# Patient Record
Sex: Female | Born: 1980 | Hispanic: No | Marital: Married | Smoking: Never smoker
Health system: Southern US, Community
[De-identification: ages and names within clinical notes are randomized; demographics above are authoritative.]

## PROBLEM LIST (undated history)

## (undated) DIAGNOSIS — Z8701 Personal history of pneumonia (recurrent): Secondary | ICD-10-CM

## (undated) DIAGNOSIS — Z5189 Encounter for other specified aftercare: Secondary | ICD-10-CM

## (undated) DIAGNOSIS — Z8619 Personal history of other infectious and parasitic diseases: Secondary | ICD-10-CM

## (undated) HISTORY — DX: Personal history of other infectious and parasitic diseases: Z86.19

## (undated) HISTORY — DX: Encounter for other specified aftercare: Z51.89

## (undated) HISTORY — PX: OVARIAN CYST REMOVAL: SHX89

## (undated) HISTORY — PX: OTHER SURGICAL HISTORY: SHX169

## (undated) HISTORY — DX: Personal history of pneumonia (recurrent): Z87.01

---
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
## 2004-03-19 ENCOUNTER — Ambulatory Visit: Payer: Self-pay

## 2004-04-06 ENCOUNTER — Emergency Department: Payer: Self-pay | Admitting: Emergency Medicine

## 2004-04-07 ENCOUNTER — Ambulatory Visit: Payer: Self-pay | Admitting: Emergency Medicine

## 2005-02-12 ENCOUNTER — Ambulatory Visit: Payer: Self-pay

## 2005-09-13 ENCOUNTER — Other Ambulatory Visit: Payer: Self-pay

## 2005-09-13 ENCOUNTER — Emergency Department: Payer: Self-pay | Admitting: Internal Medicine

## 2006-01-28 ENCOUNTER — Observation Stay: Payer: Self-pay

## 2006-02-01 ENCOUNTER — Inpatient Hospital Stay: Payer: Self-pay | Admitting: Obstetrics & Gynecology

## 2007-04-28 HISTORY — PX: CHOLECYSTECTOMY: SHX55

## 2013-07-28 LAB — BASIC METABOLIC PANEL
BUN: 10 mg/dL (ref 4–21)
Creatinine: 0.7 mg/dL (ref 0.5–1.1)
Glucose: 87 mg/dL
Potassium: 4.6 mmol/L (ref 3.4–5.3)
Sodium: 141 mmol/L (ref 137–147)

## 2013-07-28 LAB — LIPID PANEL
CHOLESTEROL: 169 mg/dL (ref 0–200)
HDL: 50 mg/dL (ref 35–70)
LDL CALC: 99 mg/dL
TRIGLYCERIDES: 98 mg/dL (ref 40–160)

## 2013-07-28 LAB — HM PAP SMEAR: HM Pap smear: NEGATIVE

## 2013-07-28 LAB — TSH: TSH: 1.3 u[IU]/mL (ref 0.41–5.90)

## 2013-07-28 LAB — HEPATIC FUNCTION PANEL
ALT: 12 U/L (ref 7–35)
AST: 16 U/L (ref 13–35)

## 2014-12-20 ENCOUNTER — Ambulatory Visit (INDEPENDENT_AMBULATORY_CARE_PROVIDER_SITE_OTHER): Payer: Commercial Managed Care - PPO | Admitting: Family Medicine

## 2014-12-20 ENCOUNTER — Encounter: Payer: Self-pay | Admitting: Family Medicine

## 2014-12-20 VITALS — BP 100/70 | HR 72 | Temp 99.4°F | Resp 16 | Wt 192.0 lb

## 2014-12-20 DIAGNOSIS — Z8701 Personal history of pneumonia (recurrent): Secondary | ICD-10-CM | POA: Insufficient documentation

## 2014-12-20 DIAGNOSIS — J309 Allergic rhinitis, unspecified: Secondary | ICD-10-CM | POA: Insufficient documentation

## 2014-12-20 DIAGNOSIS — R319 Hematuria, unspecified: Secondary | ICD-10-CM | POA: Diagnosis not present

## 2014-12-20 DIAGNOSIS — R109 Unspecified abdominal pain: Secondary | ICD-10-CM | POA: Diagnosis not present

## 2014-12-20 DIAGNOSIS — R002 Palpitations: Secondary | ICD-10-CM | POA: Insufficient documentation

## 2014-12-20 HISTORY — DX: Personal history of pneumonia (recurrent): Z87.01

## 2014-12-20 LAB — POCT URINALYSIS DIPSTICK
BILIRUBIN UA: NEGATIVE
GLUCOSE UA: NEGATIVE
KETONES UA: NEGATIVE
Leukocytes, UA: NEGATIVE
Nitrite, UA: NEGATIVE
Protein, UA: NEGATIVE
SPEC GRAV UA: 1.02
Urobilinogen, UA: 0.2
pH, UA: 5

## 2014-12-20 LAB — POCT URINE PREGNANCY: Preg Test, Ur: NEGATIVE

## 2014-12-20 MED ORDER — CIPROFLOXACIN HCL 500 MG PO TABS: 500.0000 mg | ORAL_TABLET | Freq: Two times a day (BID) | ORAL | Status: AC

## 2014-12-20 NOTE — Progress Notes (Signed)
Patient: Candace Mahoney Female    DOB: 30-May-1980   34 y.o.   MRN: [id number] Visit Date: 12/20/2014  Today's Provider: Mila Merry, MD   Chief Complaint  Patient presents with  . Back Pain  . Abdominal Pain   Subjective:    Abdominal Pain This is a new problem. The current episode started yesterday. The onset quality is gradual. The problem occurs constantly. The problem has been rapidly worsening. The pain is located in the LLQ and left flank. The pain is at a severity of 8/10. The pain is moderate. The quality of the pain is sharp. The abdominal pain radiates to the back and LLQ. Associated symptoms include a fever. Pertinent negatives include no constipation, diarrhea, headaches, nausea or vomiting. Nothing aggravates the pain. The pain is relieved by nothing. Treatments tried: cranberry juice. The treatment provided no relief.  Back Pain This is a new problem. The current episode started yesterday. The problem occurs constantly. The problem is unchanged. The pain is present in the lumbar spine. The quality of the pain is described as stabbing. Radiates to: around to LLQ abdominal area. The pain is at a severity of 8/10. The pain is moderate. The pain is the same all the time. Stiffness is present all day. Associated symptoms include abdominal pain, a fever and pelvic pain. Pertinent negatives include no chest pain, headaches or leg pain. The treatment provided no relief.       Allergies  Allergen Reactions  . Penicillins Rash  . Sulfa Antibiotics Rash   Previous Medications   CETIRIZINE HCL (ZYRTEC ALLERGY) 10 MG CAPS    Take 1 tablet by mouth daily as needed.    Review of Systems  Constitutional: Positive for fever.  Cardiovascular: Negative for chest pain.  Gastrointestinal: Positive for abdominal pain. Negative for nausea, vomiting, diarrhea and constipation.  Genitourinary: Positive for pelvic pain.  Musculoskeletal: Positive for back pain.  Neurological:  Negative for headaches.    Social History  Substance Use Topics  . Smoking status: Never Smoker   . Smokeless tobacco: Not on file  . Alcohol Use: No   Objective:   BP 100/70 mmHg  Pulse 72  Temp(Src) 99.4 F (37.4 C) (Oral)  Resp 16  Wt 192 lb (87.091 kg)  SpO2 99%  LMP 12/06/2014  Physical Exam  General Appearance:    Alert, cooperative, no distress  Eyes:    PERRL, conjunctiva/corneas clear, EOM's intact       Lungs:     Clear to auscultation bilaterally, respirations unlabored  Heart:    Regular rate and rhythm  Abdomen:   bowel sounds present and normal in all 4 quadrants, soft, round, nontender or nondistended. CVA tenderness is present on the left     Results for orders placed or performed in visit on 12/20/14  POCT urinalysis dipstick  Result Value Ref Range   Color, UA Yellow    Clarity, UA Clear    Glucose, UA Neg    Bilirubin, UA Neg    Ketones, UA Neg    Spec Grav, UA 1.020    Blood, UA Moderate    pH, UA 5.0    Protein, UA Neg    Urobilinogen, UA 0.2    Nitrite, UA Neg    Leukocytes, UA Negative Negative  POCT urine pregnancy  Result Value Ref Range   Preg Test, Ur Negative Negative       Assessment & Plan:     1.  Abdominal pain, unspecified abdominal location UTI versus diverticulitis verus renal calculus.  - POCT urinalysis dipstick - POCT urine pregnancy  - ciprofloxacin (CIPRO) 500 MG tablet; Take 1 tablet (500 mg total) by mouth 2 (two) times daily.  Dispense: 20 tablet; Refill: 0  2. Hematuria - Urine culture Consider KUB or CT if urine culture is negative and if not improving on Cipro       Mila Merry, MD  [facility]

## 2014-12-21 LAB — URINE CULTURE: ORGANISM ID, BACTERIA: NO GROWTH

## 2014-12-24 ENCOUNTER — Ambulatory Visit: Payer: Self-pay | Admitting: Family Medicine

## 2014-12-24 ENCOUNTER — Telehealth: Payer: Self-pay | Admitting: Family Medicine

## 2014-12-24 DIAGNOSIS — R1032 Left lower quadrant pain: Secondary | ICD-10-CM

## 2014-12-24 NOTE — Telephone Encounter (Signed)
Pt called for lab results from last week.  Please return her call [phone]  Thanks Barth Kirks

## 2014-12-24 NOTE — Telephone Encounter (Signed)
Results not back yet. Called [facility], results are being faxed to office.

## 2014-12-25 NOTE — Telephone Encounter (Signed)
Needs to schedule CT abdomen. Suspect probably has a kidney stone or diverticulitis. The CT scan will determine which. Please advise patient and forward to Maralyn Sago. Have enter order into EMR/

## 2014-12-25 NOTE — Telephone Encounter (Signed)
Patient stated that the abdominal pain is not any better, still having a lot of pain when she is sitting.

## 2014-12-25 NOTE — Telephone Encounter (Signed)
Patient notified of results. Patient expressed understanding.  

## 2014-12-27 ENCOUNTER — Ambulatory Visit
Admission: RE | Admit: 2014-12-27 | Discharge: 2014-12-27 | Disposition: A | Discharge status: Home / Self Care | Payer: Commercial Managed Care - PPO | Source: Ambulatory Visit | Attending: Family Medicine | Admitting: Family Medicine

## 2014-12-27 DIAGNOSIS — R1032 Left lower quadrant pain: Secondary | ICD-10-CM | POA: Insufficient documentation

## 2014-12-27 DIAGNOSIS — K5730 Diverticulosis of large intestine without perforation or abscess without bleeding: Secondary | ICD-10-CM | POA: Insufficient documentation

## 2014-12-28 ENCOUNTER — Telehealth: Payer: Self-pay | Admitting: *Deleted

## 2014-12-28 DIAGNOSIS — R1032 Left lower quadrant pain: Secondary | ICD-10-CM

## 2014-12-28 DIAGNOSIS — R319 Hematuria, unspecified: Secondary | ICD-10-CM

## 2014-12-28 NOTE — Telephone Encounter (Signed)
-----   Message from Malva Limes, MD sent at 12/28/2014 12:45 PM EDT ----- CT scan is normal, no stones are seen. There is no sign of diverticulitis on the left. If she is still having pain then she needs referral to urologist for hematuria with left lower quadrant pain.

## 2014-12-28 NOTE — Telephone Encounter (Signed)
Patient notified of results. Patient expressed understanding.  

## 2014-12-28 NOTE — Telephone Encounter (Signed)
Patient is requesting referral be with someone close to [facility].

## 2015-01-07 ENCOUNTER — Ambulatory Visit: Payer: Commercial Managed Care - PPO | Admitting: Urology

## 2015-01-08 ENCOUNTER — Telehealth: Payer: Self-pay | Admitting: Family Medicine

## 2015-01-08 NOTE — Telephone Encounter (Signed)
Pt called saying she was still in pain.  She was in and seen dr Sherrie Mustache.  Kidney stone was ruled out but she is still having lower abd. Pain  Her call back is [phone]  Thanks Barth Kirks

## 2015-01-08 NOTE — Telephone Encounter (Signed)
Patient was referred to urology. Patient stated that she has put the appointment off for 2 weeks. Patient also stated that she decided to go to [facility] and get them to run some more tests. Patient stated that she contact office tomorrow to let us know if [facility] found anything.

## 2015-01-09 NOTE — Telephone Encounter (Signed)
Pt decided to see Candace Mahoney 01-10-15 at [facility] # [id number] expires 01-09-16

## 2015-01-10 ENCOUNTER — Encounter: Payer: Self-pay | Admitting: Obstetrics and Gynecology

## 2015-01-10 ENCOUNTER — Ambulatory Visit (INDEPENDENT_AMBULATORY_CARE_PROVIDER_SITE_OTHER): Payer: Commercial Managed Care - PPO | Admitting: Obstetrics and Gynecology

## 2015-01-10 VITALS — BP 131/78 | HR 94 | Ht 64.0 in | Wt 188.6 lb

## 2015-01-10 DIAGNOSIS — R1032 Left lower quadrant pain: Secondary | ICD-10-CM | POA: Diagnosis not present

## 2015-01-10 NOTE — Progress Notes (Signed)
01/10/2015 8:28 AM   Candace Mahoney 1980-09-26 [id number]  Referring provider: Malva Limes, MD [address] [place], [place] [address]  Chief Complaint  Patient presents with  . Other    lower quad pain-left side    HPI: Patient is a 34 year old female presenting today as a referral for microscopic hematuria and left lower quadrant pain 2 weeks. She was initially evaluated by her primary care provider who ordered a CT scan- stone protocol. This was negative for GU pathology. [facility] with complaints of increased left lower quadrant pain. A transvaginal ultrasound was performed at that time with inconclusive findings.  Pain is described as bilateral pelvic pain with left significantly greater than right she reports the seated position makes her pain worse. Lying down makes the pain better. Pain is intermittent and sharp.  Patient denies gross hematuria, dysuria, urgency, frequency or fevers.  12/20/14 Moderate RBCs noted on dip.  No micro analysis done.  Urine culture negative.  Never smoker. Has experienced recent diarrhea with associated GI upset.   PMH: Past Medical History  Diagnosis Date  . History of chicken pox   . H/O: pneumonia 12/20/2014    Complicated by septic shock when living in [place] per patient report.     Surgical History: Past Surgical History  Procedure Laterality Date  . Cholecystectomy  2009    [facility]  . Ovarian cyst removal    . Partial fallopian tube removal      Home Medications:    Medication List       This list is accurate as of: 01/10/15 11:59 PM.  Always use your most recent med list.               ZYRTEC ALLERGY 10 MG Caps  Generic drug:  Cetirizine HCl  Take 1 tablet by mouth daily as needed.        Allergies:  Allergies  Allergen Reactions  . Penicillins Rash  . Sulfa Antibiotics Rash    Family History: Family History  Problem Relation Age  of Onset  . Hypertension Mother   . Lung cancer Paternal Uncle     smoker  . Mesothelioma Paternal Uncle   . Diabetes Maternal Grandmother   . Stroke Maternal Grandmother   . Lung cancer Maternal Grandfather     smoker  . Kidney disease Neg Hx   . Kidney cancer Neg Hx   . Prostate cancer Neg Hx     Social History:  reports that she has never smoked. She does not have any smokeless tobacco history on file. She reports that she does not drink alcohol or use illicit drugs.  ROS: UROLOGY Frequent Urination?: No Hard to postpone urination?: No Burning/pain with urination?: No Get up at night to urinate?: No Leakage of urine?: No Urine stream starts and stops?: No Trouble starting stream?: No Do you have to strain to urinate?: No Blood in urine?: Yes Urinary tract infection?: No Sexually transmitted disease?: No Injury to kidneys or bladder?: No Painful intercourse?: No Weak stream?: No Currently pregnant?: No Vaginal bleeding?: No Last menstrual period?: 01/06/15  Gastrointestinal Nausea?: Yes Vomiting?: No Indigestion/heartburn?: No Diarrhea?: Yes Constipation?: Yes  Constitutional Fever: No Night sweats?: No Weight loss?: No Fatigue?: Yes  Skin Skin rash/lesions?: No Itching?: No  Eyes Blurred vision?: No Double vision?: No  Ears/Nose/Throat Sore throat?: No Sinus problems?: No  Hematologic/Lymphatic Swollen glands?: No Easy bruising?: No  Cardiovascular Leg  swelling?: No Chest pain?: No  Respiratory Cough?: No Shortness of breath?: No  Endocrine Excessive thirst?: No  Musculoskeletal Back pain?: No Joint pain?: No  Neurological Headaches?: No Dizziness?: No  Psychologic Depression?: No Anxiety?: No  Physical Exam: BP 131/78 mmHg  Pulse 94  Ht 5\' 4"  (1.626 m)  Wt 188 lb 9.6 oz (85.548 kg)  BMI 32.36 kg/m2  LMP 01/06/2015 (Exact Date)  Constitutional:  Alert and oriented, No acute distress. HEENT: [place] AT, moist mucus membranes.   Trachea midline, no masses. Cardiovascular: No clubbing, cyanosis, or edema. Respiratory: Normal respiratory effort, no increased work of breathing. GI: Abdomen is soft, nontender, nondistended, no abdominal masses GU: No CVA tenderness.  Skin: No rashes, bruises or suspicious lesions. Lymph: No cervical or inguinal adenopathy. Neurologic: Grossly intact, no focal deficits, moving all 4 extremities. Psychiatric: Normal mood and affect.  Laboratory Data:   Urinalysis    Component Value Date/Time   BILIRUBINUR Neg 12/20/2014 1007   PROTEINUR Neg 12/20/2014 1007   UROBILINOGEN 0.2 12/20/2014 1007   NITRITE Neg 12/20/2014 1007   LEUKOCYTESUR Negative 12/20/2014 1007    Pertinent Imaging: EXAM:  CT ABDOMEN AND PELVIS WITHOUT CONTRAST FINDINGS: Lung bases clear.  Gallbladder surgically absent.  No urine tract calcification, hydronephrosis, or ureteral dilatation.  Within limits of a nonenhanced exam, no focal abnormalities of the liver, spleen, pancreas, kidneys, or adrenal glands.  Low-attenuation circulating blood question anemia.  Unremarkable bladder and distal ureters.  Several small pelvic phleboliths noted.  Minimally prominent uterine size with unremarkable RIGHT ovary and and question LEFT ovary adjacent to uterus.  Few scattered diverticula RIGHT colon.  Normal appendix. Stomach and bowel loops otherwise normal appearance for technique. Tiny amount of dependent free pelvic fluid at cul de sac. No mass, adenopathy, free air, or hernia. Osteitis condensans ilii. No acute bone lesions.  IMPRESSION: Few uncomplicated RIGHT colonic diverticula. Tiny amount of nonspecific free pelvic fluid in cul-de-sac. Question anemia. No additional intra-abdominal or intrapelvic abnormalities.  Indication: R10.32 Left lower quadrant pain, ABDOMINAL PAIN Findings:  The uterus is retropositioned. It measures 10.6 x 5.1 x 6.9 cm. The endometrium is normal for  age and menstrual status and measures 0.9 cm. Nabothian cysts are noted in the cervix. Trace free fluid in the pelvis. The right ovary is normal in appearance and contains multiple small follicles. The right ovary measures 3.5 x 2.5 x 2.4 cm. The right ovary demonstrate normal arterial and venous blood flow. The left ovary is normal in appearance and demonstrates several small follicles. The left ovary measures 2.8 x 1.6 x 1.8 cm. The left ovary demonstrates normal arterial and venous blood flow. The urinary bladder is normal in appearance. Impression: 1. Normal appearance of the bilateral ovaries. 2. Normal endometrial stripe for age and menstrual status.  Discussed by Dr. Quintin Alto with Dr. Georga Bora at 17:41 on 01/08/2015.  The preliminary report (critical or emergent communication) was reviewed prior to this dictation and there are no substantial differences between the preliminary results and the impressions in this final report.  Assessment & Plan:    1. Left lower quadrant pain- Patient's pelvic pain is of unclear etiology at this time. Transvaginal ultrasound as well as CT abdomen and pelvis without contrast performed recently with unremarkable findings. Pain is not reproducible today on exam. She has no urinary symptoms. Given patient's recent history of diarrhea and GI upset I feel that her left lower quadrant pain is most likely GI related. Red flags reviewed we'll recheck symptoms  in 1 week. - Urinalysis, Complete  2. Microscopic hematuria- microscopic hematuria seen on today's dip the patient reports that she is currently menstruating. She declines catheter placement for urine specimen today she will return in 1 week for repeat UA and recheck on lower quadrant pain.   Return in about 1 week (around 01/17/2015) for recheck micro hematuria.  These notes generated with voice recognition software. I apologize for typographical errors.  Earlie Lou, FNP  [facility] [address] [place], [place] [address] [phone]

## 2015-01-11 ENCOUNTER — Encounter: Payer: Self-pay | Admitting: Obstetrics and Gynecology

## 2015-01-11 LAB — URINALYSIS, COMPLETE

## 2015-01-17 ENCOUNTER — Encounter: Payer: Self-pay | Admitting: Obstetrics and Gynecology

## 2015-01-17 ENCOUNTER — Ambulatory Visit (INDEPENDENT_AMBULATORY_CARE_PROVIDER_SITE_OTHER): Payer: Commercial Managed Care - PPO | Admitting: Obstetrics and Gynecology

## 2015-01-17 VITALS — BP 107/69 | HR 86 | Resp 16 | Ht 64.0 in | Wt 191.6 lb

## 2015-01-17 DIAGNOSIS — R3129 Other microscopic hematuria: Secondary | ICD-10-CM

## 2015-01-17 DIAGNOSIS — R312 Other microscopic hematuria: Secondary | ICD-10-CM | POA: Diagnosis not present

## 2015-01-17 LAB — URINALYSIS, COMPLETE
Bilirubin, UA: NEGATIVE
GLUCOSE, UA: NEGATIVE
Ketones, UA: NEGATIVE
Nitrite, UA: NEGATIVE
PROTEIN UA: NEGATIVE
Specific Gravity, UA: 1.01 (ref 1.005–1.030)
Urobilinogen, Ur: 0.2 mg/dL (ref 0.2–1.0)
pH, UA: 6.5 (ref 5.0–7.5)

## 2015-01-17 LAB — MICROSCOPIC EXAMINATION
Bacteria, UA: NONE SEEN
Epithelial Cells (non renal): NONE SEEN /hpf (ref 0–10)
RBC, UA: NONE SEEN /hpf (ref 0–?)
WBC, UA: NONE SEEN /hpf (ref 0–?)

## 2015-01-17 NOTE — Progress Notes (Signed)
01/17/2015 9:57 AM   Candace Mahoney 1980-07-29 [id number]  Referring Jatia Musa: Malva Limes, MD [address] [place], [place] [address]  Chief Complaint  Patient presents with  . Hematuria    Follow-up.  Last OV 01/10/2015.    HPI: Patient is a 34 year old female presenting today as a referral for microscopic hematuria and left lower quadrant pain 2 weeks. She was initially evaluated by her primary care Jimia Gentles who ordered a CT scan- stone protocol. This was negative for GU pathology. [facility] with complaints of increased left lower quadrant pain. A transvaginal ultrasound was performed at that time with inconclusive findings.  Pain is described as bilateral pelvic pain with left significantly greater than right she reports the seated position makes her pain worse. Lying down makes the pain better. Pain is intermittent and sharp. Patient denies gross hematuria, dysuria, urgency, frequency or fevers. 12/20/14 Moderate RBCs noted on dip. No micro analysis done. Urine culture negative. Never smoker. Has experienced recent diarrhea with associated GI upset.   At last visit patient was menstruating.  She presents today for micro UA and recheck for abdominal pain.  She reports no new urinary symptoms     PMH: Past Medical History  Diagnosis Date  . History of chicken pox   . H/O: pneumonia 12/20/2014    Complicated by septic shock when living in [place] per patient report.     Surgical History: Past Surgical History  Procedure Laterality Date  . Cholecystectomy  2009    [facility]  . Ovarian cyst removal    . Partial fallopian tube removal      Home Medications:    Medication List       This list is accurate as of: 01/17/15  9:57 AM.  Always use your most recent med list.               ZYRTEC ALLERGY 10 MG Caps  Generic drug:  Cetirizine HCl  Take 1 tablet by mouth daily as needed.         Allergies:  Allergies  Allergen Reactions  . Penicillins Rash  . Sulfa Antibiotics Rash    Family History: Family History  Problem Relation Age of Onset  . Hypertension Mother   . Lung cancer Paternal Uncle     smoker  . Mesothelioma Paternal Uncle   . Diabetes Maternal Grandmother   . Stroke Maternal Grandmother   . Lung cancer Maternal Grandfather     smoker  . Kidney disease Neg Hx   . Kidney cancer Neg Hx   . Prostate cancer Neg Hx     Social History:  reports that she has never smoked. She does not have any smokeless tobacco history on file. She reports that she does not drink alcohol or use illicit drugs.  ROS: UROLOGY Frequent Urination?: No Hard to postpone urination?: No Burning/pain with urination?: No Get up at night to urinate?: No Leakage of urine?: No Urine stream starts and stops?: No Trouble starting stream?: No Do you have to strain to urinate?: No Blood in urine?: Yes Urinary tract infection?: No Sexually transmitted disease?: No Injury to kidneys or bladder?: No Painful intercourse?: No Weak stream?: No Currently pregnant?: No Vaginal bleeding?: No Last menstrual period?: 01/06/15  Gastrointestinal Nausea?: No Vomiting?: No Indigestion/heartburn?: No Diarrhea?: No Constipation?: No  Constitutional Fever: No Night sweats?: No Weight loss?: No Fatigue?: No  Skin Skin rash/lesions?: No Itching?: No  Eyes Blurred vision?: No Double vision?: No  Ears/Nose/Throat Sore throat?: No Sinus problems?: No  Hematologic/Lymphatic Swollen glands?: No Easy bruising?: No  Cardiovascular Leg swelling?: No Chest pain?: No  Respiratory Cough?: No Shortness of breath?: No  Endocrine Excessive thirst?: No  Musculoskeletal Back pain?: No Joint pain?: No  Neurological Headaches?: No Dizziness?: No  Psychologic Depression?: No Anxiety?: No  Physical Exam: BP 107/69 mmHg  Pulse 86  Resp 16  Ht  (1.626 m)  Wt 191  lb 9.6 oz (86.909 kg)  BMI 32.87 kg/m2  LMP 01/06/2015 (Exact Date)  Constitutional:  Alert and oriented, No acute distress. HEENT:  AT, moist mucus membranes.  Trachea midline, no masses. Cardiovascular: No clubbing, cyanosis, or edema. Respiratory: Normal respiratory effort, no increased work of breathing. Skin: No rashes, bruises or suspicious lesions. Neurologic: Grossly intact, no focal deficits, moving all 4 extremities. Psychiatric: Normal mood and affect.  Laboratory Data:   Urinalysis    Component Value Date/Time   BILIRUBINUR Neg 12/20/2014 1007   PROTEINUR Neg 12/20/2014 1007   UROBILINOGEN 0.2 12/20/2014 1007   NITRITE Neg 12/20/2014 1007   LEUKOCYTESUR Negative 12/20/2014 1007    Pertinent Imaging:  Assessment & Plan:    1. Microscopic hematuria- Micro urinalysis completely negative today. Specifically no microscopic hematuria. Patient follow up with [place] on an as-needed basis should she develop any urinary symptoms. - Urinalysis, Complete  2. Left lower quadrant pain-Patient reports pain has significantly improved but not yet completely resolved. I recommend patient follow up with her primary care Zenora Karpel as needed.  Return if symptoms worsen or fail to improve.  These notes generated with voice recognition software. I apologize for typographical errors.  Earlie Lou, FNP  [facility] [address], [address] [place], [place] [address] [phone]

## 2015-01-22 ENCOUNTER — Ambulatory Visit: Payer: Commercial Managed Care - PPO | Admitting: Urology

## 2015-01-31 ENCOUNTER — Encounter: Payer: Self-pay | Admitting: *Deleted

## 2015-02-25 ENCOUNTER — Ambulatory Visit: Payer: Self-pay | Admitting: General Surgery

## 2015-04-18 ENCOUNTER — Encounter: Payer: Self-pay | Admitting: *Deleted

## 2015-12-30 ENCOUNTER — Ambulatory Visit
Admission: EM | Admit: 2015-12-30 | Discharge: 2015-12-30 | Disposition: A | Discharge status: Home / Self Care | Payer: Commercial Managed Care - PPO | Attending: Family Medicine | Admitting: Family Medicine

## 2015-12-30 DIAGNOSIS — T148 Other injury of unspecified body region: Secondary | ICD-10-CM | POA: Diagnosis not present

## 2015-12-30 DIAGNOSIS — W57XXXA Bitten or stung by nonvenomous insect and other nonvenomous arthropods, initial encounter: Secondary | ICD-10-CM | POA: Diagnosis not present

## 2015-12-30 DIAGNOSIS — T7840XA Allergy, unspecified, initial encounter: Secondary | ICD-10-CM

## 2015-12-30 MED ORDER — RANITIDINE HCL 150 MG PO CAPS: 150.0000 mg | ORAL_CAPSULE | Freq: Two times a day (BID) | ORAL | 0 refills | Status: DC

## 2015-12-30 MED ORDER — MUPIROCIN 2 % EX OINT: 1.0000 "application " | TOPICAL_OINTMENT | Freq: Two times a day (BID) | CUTANEOUS | 0 refills | Status: DC

## 2015-12-30 MED ORDER — EPINEPHRINE HCL 1 MG/ML IJ SOLN
0.3000 mg | Freq: Once | INTRAMUSCULAR | Status: AC
  Administered 2015-12-30: 0.3 mg via SUBCUTANEOUS

## 2015-12-30 MED ORDER — LORATADINE 10 MG PO TABS: 10.0000 mg | ORAL_TABLET | Freq: Every day | ORAL | 0 refills | Status: DC

## 2015-12-30 NOTE — ED Notes (Signed)
Patient waited 15 minutes and no reactions were noted. Injection site is unremarkable. 

## 2015-12-30 NOTE — ED Triage Notes (Addendum)
Patient c/o of an insect bite to her forehead. She says it was swollen, but now there doesn't seem to be any swelling. She mentions that it does hurt and she felt flushed, with a little shortness of breath. She mentions that her arms feel numb.

## 2015-12-30 NOTE — ED Provider Notes (Signed)
[facility]    CSN: [id number] Arrival date & time: 12/30/15  [id number]  First Provider Contact:  First MD Initiated Contact with Patient 12/30/15 1837        History   Chief Complaint Chief Complaint  Patient presents with  . Insect Bite    HPI Candace Mahoney is a 35 y.o. female.   Patient is here because of 2 insect bites on the forehead. She states he was driving her oldest daughter to work and she states that she flushed away something that was in the peripheral vision defect was black and they've an insect. When she got to the workplace for her daughter she states that her daughter was concerned that she had 2 large welts on her forehead. States she got home she became lightheaded dizzy shortness of breath and states that she has some wheezing. She states the wheezing is better now but she's had some numbness and tingling in both hands. She cannot say what insect this was that bit her symptoms.    Past history cholecystectomy and ovarian cyst removal and partial fallopian tube removal s she does not smoke. No significant family medical history  pertinent to today's visit. He is allergic to penicillin and sulfa medications.   The history is provided by the patient. No language interpreter was used.  Allergic Reaction  Presenting symptoms: difficulty breathing, itching, swelling and wheezing   Severity:  Moderate Duration:  2 hours Prior allergic episodes:  Insect allergies Context: insect bite/sting   Relieved by:  Nothing Worsened by:  Nothing Ineffective treatments:  None tried   Past Medical History:  Diagnosis Date  . H/O: pneumonia 12/20/2014   Complicated by septic shock when living in [place] per patient report.   Marland Kitchen. History of chicken pox     Patient Active Problem List   Diagnosis Date Noted  . Allergic rhinitis 12/20/2014  . Palpitation 12/20/2014    Past Surgical History:  Procedure Laterality Date  . CHOLECYSTECTOMY  [address]2009   [facility]  . OVARIAN CYST REMOVAL    . Partial Fallopian tube removal      OB History    Gravida Para Term Preterm AB Living   5 4     1      SAB TAB Ectopic Multiple Live Births                   Home Medications    Prior to Admission medications   Medication Sig Start Date End Date Taking? Authorizing Provider  Cetirizine HCl (ZYRTEC ALLERGY) 10 MG CAPS Take 1 tablet by mouth daily as needed.    Historical Provider, MD  loratadine (CLARITIN) 10 MG tablet Take 1 tablet (10 mg total) by mouth daily. Take 1 tablet in the morning. As needed for itching. 12/30/15   Hassan RowanEugene Tabari Volkert, MD  mupirocin ointment (BACTROBAN) 2 % Apply 1 application topically 2 (two) times daily. 12/30/15   Hassan RowanEugene Sinahi Knights, MD  ranitidine (ZANTAC) 150 MG capsule Take 1 capsule (150 mg total) by mouth 2 (two) times daily. 12/30/15   Hassan RowanEugene Beanca Kiester, MD    Family History Family History  Problem Relation Age of Onset  . Hypertension Mother   . Diabetes Maternal Grandmother   . Stroke Maternal Grandmother   . Lung cancer Maternal Grandfather     smoker  . Lung cancer Paternal Uncle     smoker  . Mesothelioma Paternal Uncle   . Kidney disease Neg Hx   .  Kidney cancer Neg Hx   . Prostate cancer Neg Hx     Social History Social History  Substance Use Topics  . Smoking status: Never Smoker  . Smokeless tobacco: Never Used  . Alcohol use No     Allergies   Penicillins and Sulfa antibiotics   Review of Systems Review of Systems  HENT: Positive for facial swelling.   Respiratory: Positive for cough and wheezing.   Skin: Positive for itching.  All other systems reviewed and are negative.    Physical Exam Triage Vital Signs ED Triage Vitals  Enc Vitals Group     BP 12/30/15 1829 121/68     Pulse Rate 12/30/15 1829 68     Resp 12/30/15 1829 18     Temp 12/30/15 1829 98.1 F (36.7 C)     Temp Source 12/30/15 1829 Oral     SpO2 12/30/15 1829 100 %     Weight 12/30/15 1828 190 lb (86.2 kg)     Height  12/30/15 1828 5\' 4"  (1.626 m)     Head Circumference --      Peak Flow --      Pain Score 12/30/15 1829 6     Pain Loc --      Pain Edu? --      Excl. in GC? --    No data found.   Updated Vital Signs BP 121/68 (BP Location: Left Arm)   Pulse 68   Temp 98.1 F (36.7 C) (Oral)   Resp 18   Ht 5\' 4"  (1.626 m)   Wt 190 lb (86.2 kg)   LMP 10/28/2015   SpO2 100%   BMI 32.61 kg/m   Visual Acuity Right Eye Distance:   Left Eye Distance:   Bilateral Distance:    Right Eye Near:   Left Eye Near:    Bilateral Near:     Physical Exam  Constitutional: She is oriented to person, place, and time. She appears well-developed.  HENT:  Head: Normocephalic.  Right Ear: Hearing, tympanic membrane, external ear and ear canal normal.  Left Ear: Hearing, tympanic membrane and ear canal normal.  Nose: Nose normal.  Mouth/Throat: Uvula is midline and oropharynx is clear and moist.  Eyes: Conjunctivae and lids are normal. Pupils are equal, round, and reactive to light.  Neck: Normal range of motion. Neck supple.  Cardiovascular: Normal rate and regular rhythm.   Pulmonary/Chest: Effort normal and breath sounds normal.  Musculoskeletal: Normal range of motion.  Neurological: She is alert and oriented to person, place, and time.  Skin: Skin is intact. No bruising and no rash noted. No erythema.  Patient with no significant redness and erythema over the forehead area there is tender to palpation.  Psychiatric: She has a normal mood and affect. Her behavior is normal.  Vitals reviewed.    UC Treatments / Results  Labs (all labs ordered are listed, but only abnormal results are displayed) Labs Reviewed - No data to display  EKG  EKG Interpretation None       Radiology No results found.  Procedures Procedures (including critical care time)  Medications Ordered in UC Medications  EPINEPHrine (ADRENALIN) injection 0.3 mg (not administered)     Initial Impression /  Assessment and Plan / UC Course  I have reviewed the triage vital signs and the nursing notes.  Pertinent labs & imaging results that were available during my care of the patient were reviewed by me and considered in my medical decision making (  see chart for details).  Clinical Course    Will treat patient with epinephrine because of the history of shortness of breath and wheezing that she had earlier. I do not think she needs to be on his type of steroid treatment. Will treat with antihistamines blockade Claritin 10 mg and Zantac 150 twice a day for about the next 5-7 days. We'll give her a prescription for Bactroban ointment to apply in case area becomes infected and will give a note for work for tomorrow just in case as needed. Follow-up Dr. Sherrie Mustache in 2-3 days as needed. Final Clinical Impressions(s) / UC Diagnoses   Final diagnoses:  Insect bites  Allergic reaction, initial encounter    New Prescriptions New Prescriptions   LORATADINE (CLARITIN) 10 MG TABLET    Take 1 tablet (10 mg total) by mouth daily. Take 1 tablet in the morning. As needed for itching.   MUPIROCIN OINTMENT (BACTROBAN) 2 %    Apply 1 application topically 2 (two) times daily.   RANITIDINE (ZANTAC) 150 MG CAPSULE    Take 1 capsule (150 mg total) by mouth 2 (two) times daily.     Hassan Rowan, MD 12/30/15 Ebony Cargo

## 2016-10-08 IMAGING — CT CT RENAL STONE PROTOCOL
2 of 4 series · 16 of 46 positions shown, 18 images · non-contrast
Comparison: None

CLINICAL DATA: LEFT lower quadrant and LEFT flank pain for 2 weeks,
microscopic hematuria, past history of ovarian cyst excision
(laterality unknown)

EXAM:
CT ABDOMEN AND PELVIS WITHOUT CONTRAST
TECHNIQUE: Multidetector CT imaging of the abdomen and pelvis was performed
following the standard protocol without IV contrast. Sagittal and
coronal MPR images reconstructed from axial data set. Oral contrast
not administered for this indication.

[Series 2: stone · axial · 0.64mm/px · z∈[-1075,-640]mm · 13 of 95 slices shown, 15 images]
[im 4/95  soft-tissue]
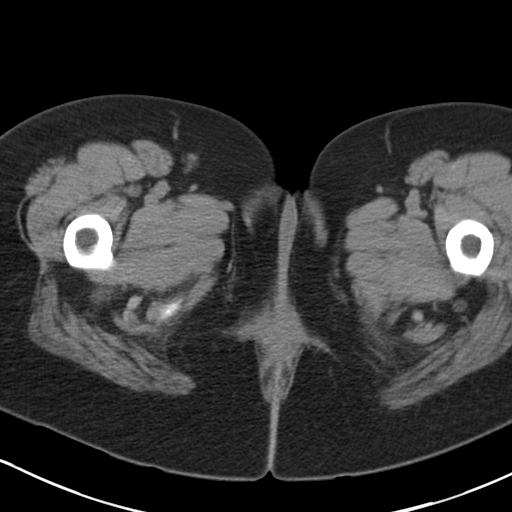
[im 4/95  bone]
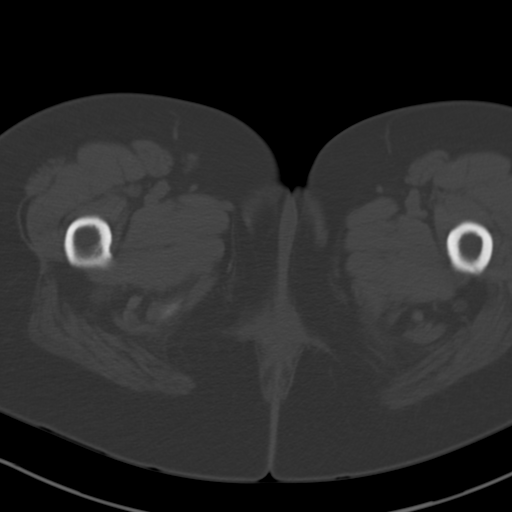
[im 12/95  soft-tissue]
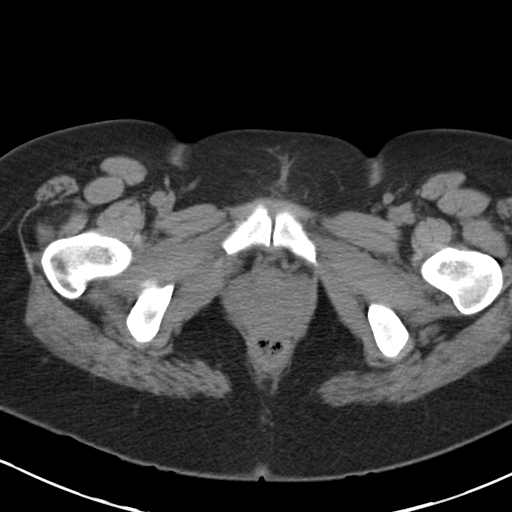
[im 19/95  soft-tissue]
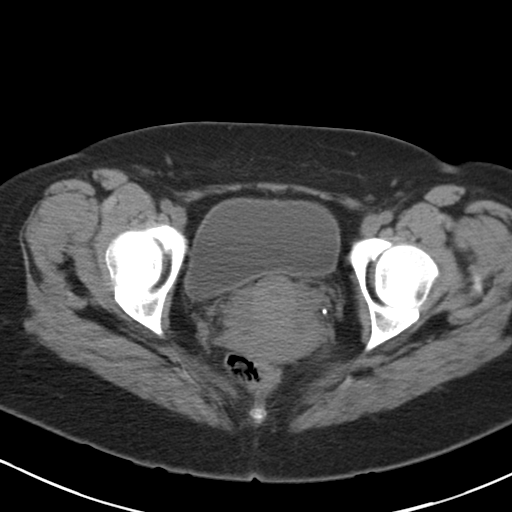
[im 27/95  soft-tissue]
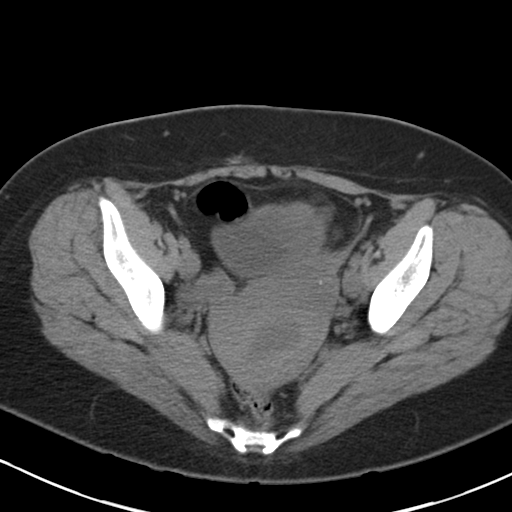
[im 34/95  soft-tissue]
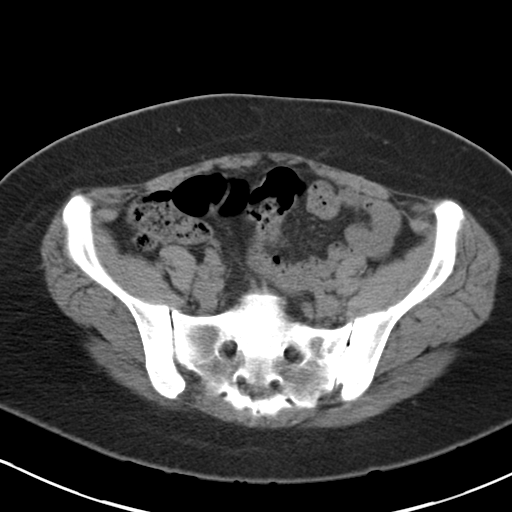
[im 42/95  soft-tissue]
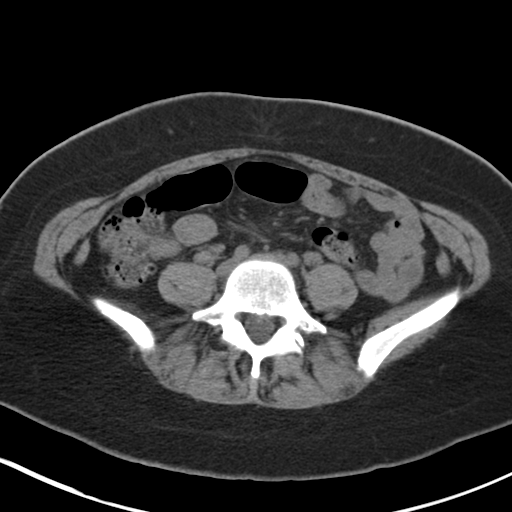
[im 49/95  soft-tissue]
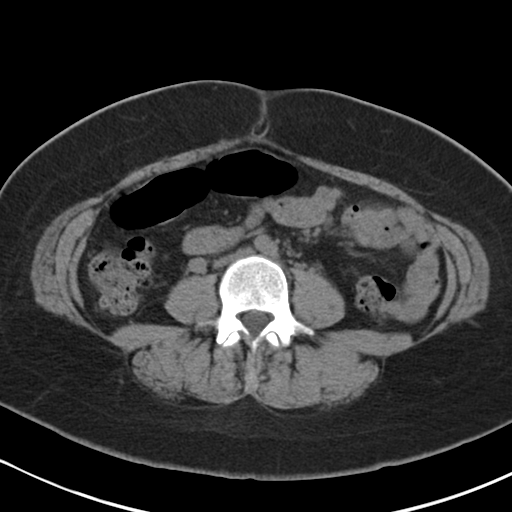
[im 53/95  soft-tissue]
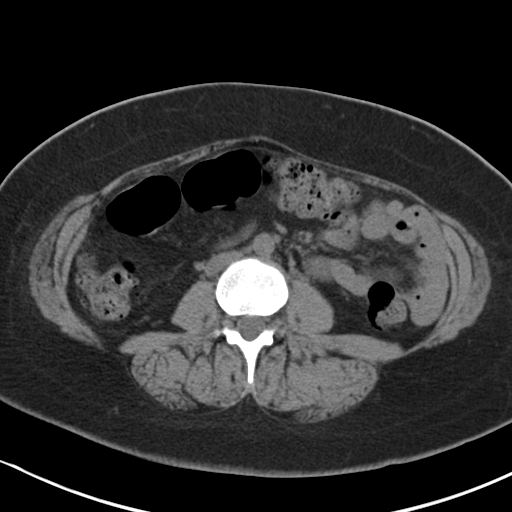
[im 61/95  soft-tissue]
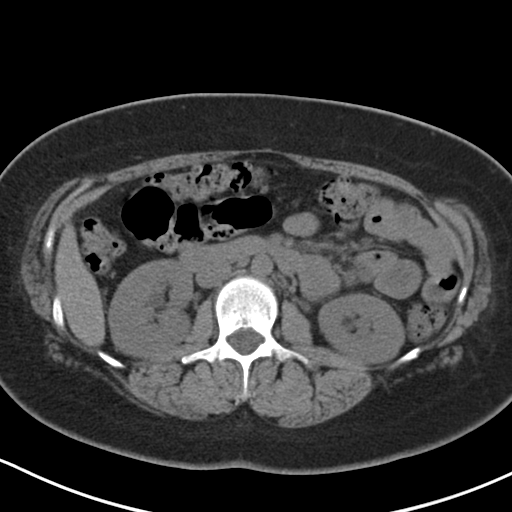
[im 61/95  bone]
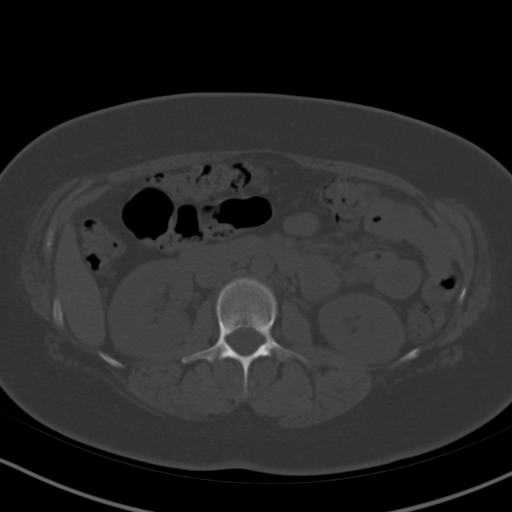
[im 68/95  soft-tissue]
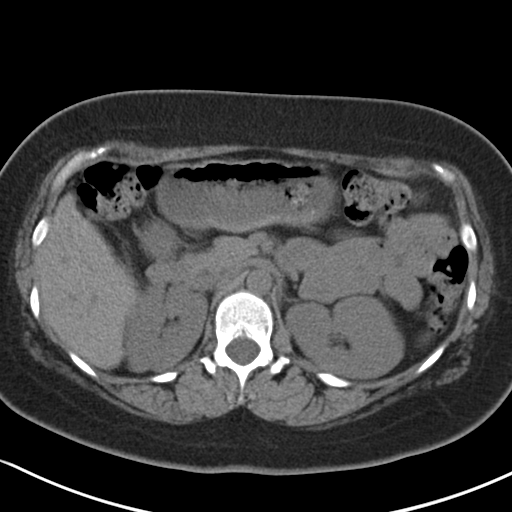
[im 76/95  soft-tissue]
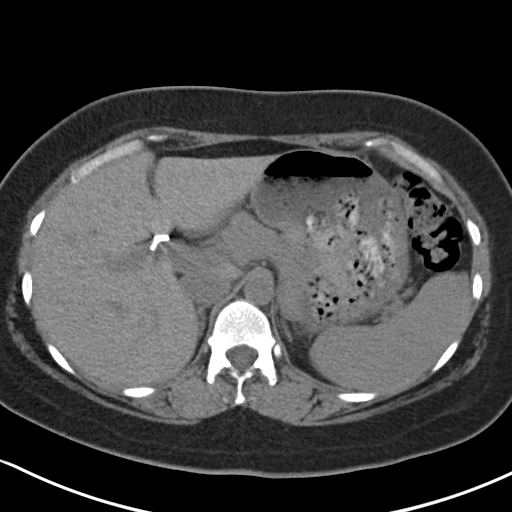
[im 83/95  soft-tissue]
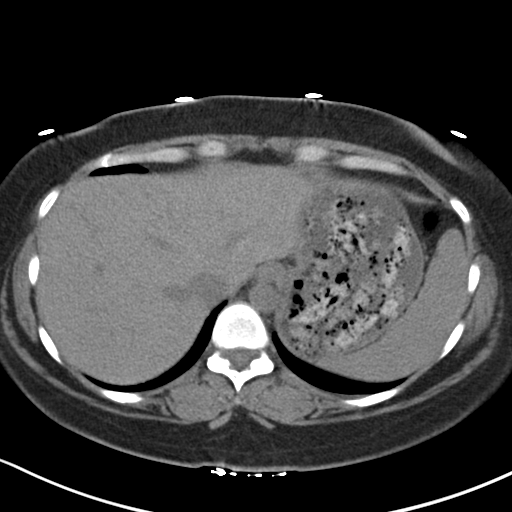
[im 91/95  soft-tissue]
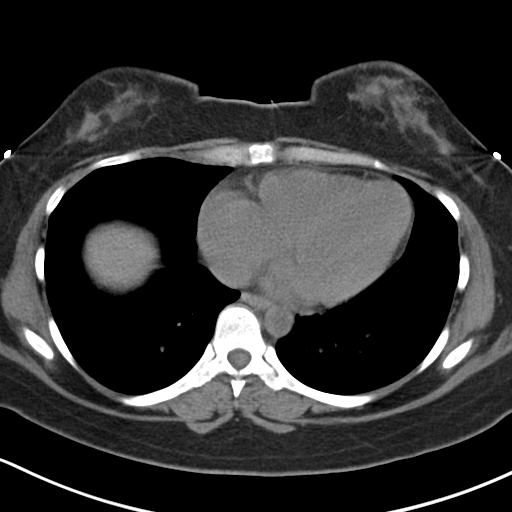

[Series 5: cor · coronal · 0.64mm/px · 3 of 135 slices shown]
[im 45/135  soft-tissue]
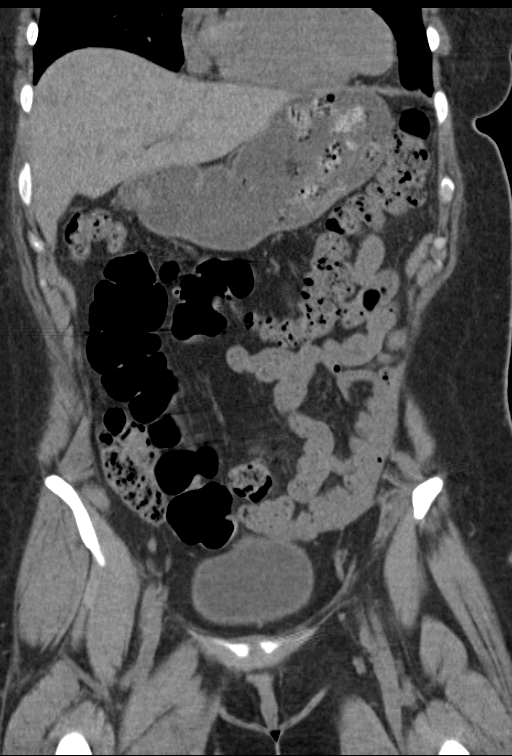
[im 60/135  soft-tissue]
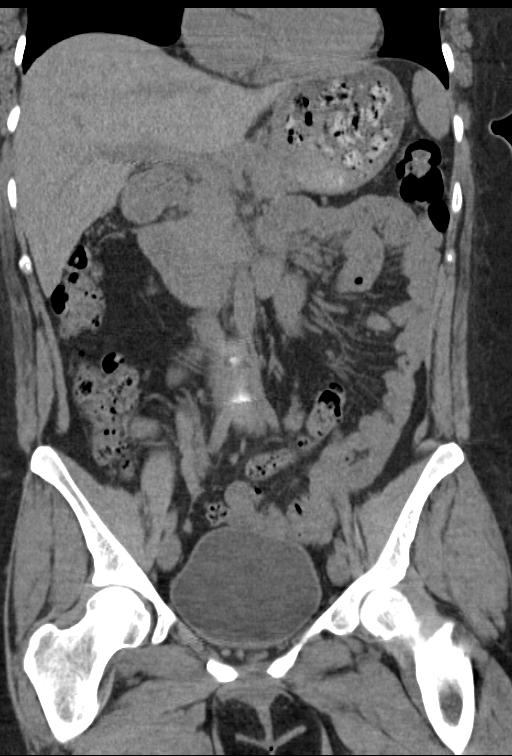
[im 75/135  soft-tissue]
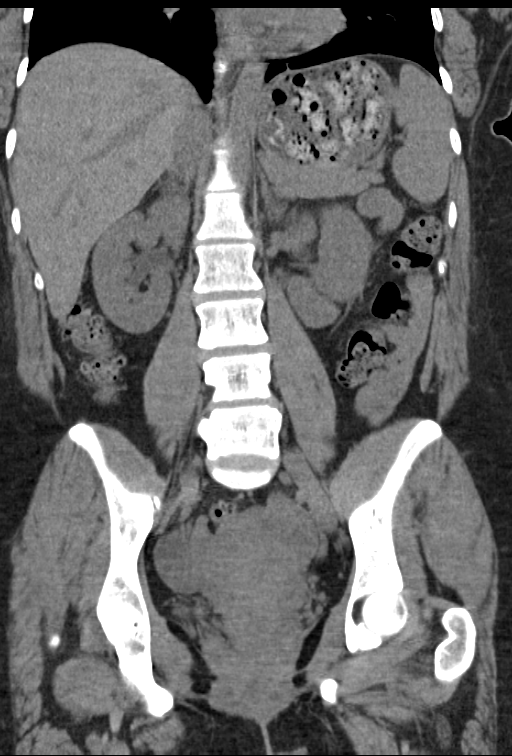

[16 of 46 positions shown; findings below may reference images not displayed]

FINDINGS: Lung bases clear.

Gallbladder surgically absent.

No urine tract calcification, hydronephrosis, or ureteral
dilatation.

Within limits of a nonenhanced exam, no focal abnormalities of the
liver, spleen, pancreas, kidneys, or adrenal glands.

Low-attenuation circulating blood question anemia.

Unremarkable bladder and distal ureters.

Several small pelvic phleboliths noted.

Minimally prominent uterine size with unremarkable RIGHT ovary and
and question LEFT ovary adjacent to uterus.

Few scattered diverticula RIGHT colon.

Normal appendix.

Stomach and bowel loops otherwise normal appearance for technique.

Tiny amount of dependent free pelvic fluid at cul de sac.

No mass, adenopathy, free air, or hernia.

Osteitis condensans ilii.

No acute bone lesions.
IMPRESSION: Few uncomplicated RIGHT colonic diverticula.

Tiny amount of nonspecific free pelvic fluid in cul-de-sac.

Question anemia.

No additional intra-abdominal or intrapelvic abnormalities.

## 2021-08-07 DIAGNOSIS — D509 Iron deficiency anemia, unspecified: Secondary | ICD-10-CM | POA: Diagnosis not present

## 2021-08-07 DIAGNOSIS — R200 Anesthesia of skin: Secondary | ICD-10-CM | POA: Diagnosis not present

## 2021-08-07 DIAGNOSIS — H43392 Other vitreous opacities, left eye: Secondary | ICD-10-CM | POA: Diagnosis not present

## 2021-08-07 DIAGNOSIS — R002 Palpitations: Secondary | ICD-10-CM | POA: Diagnosis not present

## 2021-08-07 DIAGNOSIS — R202 Paresthesia of skin: Secondary | ICD-10-CM | POA: Diagnosis not present

## 2021-08-07 DIAGNOSIS — D539 Nutritional anemia, unspecified: Secondary | ICD-10-CM | POA: Diagnosis not present

## 2021-08-07 DIAGNOSIS — Z882 Allergy status to sulfonamides status: Secondary | ICD-10-CM | POA: Diagnosis not present

## 2021-08-07 DIAGNOSIS — R0602 Shortness of breath: Secondary | ICD-10-CM | POA: Diagnosis not present

## 2021-08-07 DIAGNOSIS — H5712 Ocular pain, left eye: Secondary | ICD-10-CM | POA: Diagnosis not present

## 2021-08-07 DIAGNOSIS — Z880 Allergy status to penicillin: Secondary | ICD-10-CM | POA: Diagnosis not present

## 2021-08-07 DIAGNOSIS — H5710 Ocular pain, unspecified eye: Secondary | ICD-10-CM | POA: Diagnosis not present

## 2021-08-07 DIAGNOSIS — R42 Dizziness and giddiness: Secondary | ICD-10-CM | POA: Diagnosis not present

## 2021-08-07 DIAGNOSIS — R519 Headache, unspecified: Secondary | ICD-10-CM | POA: Diagnosis not present

## 2021-08-08 ENCOUNTER — Ambulatory Visit: Payer: Self-pay

## 2021-08-08 NOTE — Telephone Encounter (Signed)
?  Chief Complaint: medication assistance ?Symptoms: NA ?Frequency: NA ?Pertinent Negatives: NA ?Disposition: [] ED /[] Urgent Care (no appt availability in office) / [] Appointment(In office/virtual)/ []  [facility]/ [x] Home Care/ [] Refused Recommended Disposition /[] [facility]/ []  Follow-up with PCP ?Additional Notes: pt was calling to see what mg of Iron she should be taking OTC since she is anemic. She went to ED on 08/08/21 and they told her she is anemic. Pt scheduled NPA on 10/03/21 at [facility]. Pt bought Iron 22mg  and was asking if that would be ok to take until appt. I advised her that the daily dosage was 18mg  and she said that the ED provider told her the same. I advised if she wants to take the 22mg  until her appt she could just to monitor for constipation. Pt verbalized understanding and didn't need further assistance.  ?Summary: Advice recently dx amenia  ? Pt was recently dx with amenia from the ED in [facility]. Pt blood count was 9.4.  Pt has gone to get iron OTC. Pt is unable to get a new patient apt until June. Pt has not been seen with Dr. Caryn Section since 2016. Pt is wanting to know how much iron should she take until she is able to be seen as a new patient?   ?  ? ? ? ?Reason for Disposition ? Caller has medicine question only, adult not sick, AND triager answers question ? ?Answer Assessment - Initial Assessment Questions ?1. NAME of MEDICATION: "What medicine are you calling about?" ?    Iron ?2. QUESTION: "What is your question?" (e.g., double dose of medicine, side effect) ?    What mg should I be taking ?3. PRESCRIBING HCP: "Who prescribed it?" Reason: if prescribed by specialist, call should be referred to that group. ?    OTC ?4. SYMPTOMS: "Do you have any symptoms?" ?    anemic ? ?Protocols used: Medication Question Call-A-AH ? ?

## 2021-10-03 ENCOUNTER — Ambulatory Visit: Payer: Commercial Managed Care - PPO | Admitting: Family

## 2021-10-13 ENCOUNTER — Ambulatory Visit: Payer: Commercial Managed Care - PPO | Admitting: Family

## 2021-11-27 NOTE — Progress Notes (Signed)
I,[name],acting as a Education administrator for [employer], PA-C.,have documented all relevant documentation on the behalf of Candace Kirschner, PA-C,as directed by  Candace Kirschner, PA-C while in the presence of Candace Kirschner, PA-C.  New patient visit   Patient: Candace Mahoney   DOB: [birth year]   41 y.o. Female  MRN: [id number] Visit Date: 11/28/2021  Today's healthcare provider: [name], PA-C   Cc. anemia  Subjective    Candace Mahoney is a 41 y.o. female who presents today as a new patient to establish care.  HPI  Anemia: Patient presents for presents evaluation of anemia. Anemia was found by ER visit.  It has been present for 4 months.  Associated signs & symptoms: dizziness/lightheadedness, fatigue, numbness/paresthesias, and spotted/blurred vision.Reports taking 2 iron supplement capsules daily (22 mg)  and is not as fatigued. Reports heavy menstrual cycle.  Denies palpitations, fatigue, dizziness, syncope.   Past Medical History:  Diagnosis Date   Blood transfusion without reported diagnosis    H/O: pneumonia 66/09/3014   Complicated by septic shock when living in [place] per patient report.    History of chicken pox    Past Surgical History:  Procedure Laterality Date   CESAREAN SECTION     CHOLECYSTECTOMY  04/28/2007   [facility]   OVARIAN CYST REMOVAL     Partial Fallopian tube removal     Family Status  Relation Name Status   Mother  Alive   MGM  Alive   MGF  Deceased at age 63   Father  Alive   Psychiatrist  (Not Specified)   Neg Hx  (Not Specified)   Family History  Problem Relation Age of Onset   Hypertension Mother    Diabetes Maternal Grandmother    Stroke Maternal Grandmother    Lung cancer Maternal Grandfather        smoker   Lung cancer Paternal Uncle        smoker   Mesothelioma Paternal Uncle    Kidney disease Neg Hx    Kidney cancer Neg Hx    Prostate cancer Neg Hx    Social History   Socioeconomic History    Marital status: Married    Spouse name: Not on file   Number of children: Not on file   Years of education: Not on file   Highest education level: Not on file  Occupational History   Occupation: Employed    Comment: Full- Time. Works at [employer]   Smoking status: Never   Smokeless tobacco: Never  Substance and Sexual Activity   Alcohol use: No    Alcohol/week: 0.0 standard drinks of alcohol   Drug use: No   Sexual activity: Not on file  Other Topics Concern   Not on file  Social History Narrative   Not on file   Social Determinants of Health   Financial Resource Strain: Not on file  Food Insecurity: Not on file  Transportation Needs: Not on file  Physical Activity: Not on file  Stress: Not on file  Social Connections: Not on file   Outpatient Medications Prior to Visit  Medication Sig   Cetirizine HCl (ZYRTEC ALLERGY) 10 MG CAPS Take 1 tablet by mouth daily as needed.   loratadine (CLARITIN) 10 MG tablet Take 1 tablet (10 mg total) by mouth daily. Take 1 tablet in the morning. As needed for itching.   mupirocin ointment (BACTROBAN) 2 % Apply 1 application topically 2 (two) times  daily.   ranitidine (ZANTAC) 150 MG capsule Take 1 capsule (150 mg total) by mouth 2 (two) times daily.   No facility-administered medications prior to visit.   Allergies  Allergen Reactions   Penicillins Rash   Sulfa Antibiotics Rash    Immunization History  Administered Date(s) Administered   MMR 05/25/2011   Tdap 05/25/2011    Health Maintenance  Topic Date Due   COVID-19 Vaccine (1) Never done   HIV Screening  Never done   Hepatitis C Screening  Never done   PAP SMEAR-Modifier  07/28/2016   TETANUS/TDAP  05/24/2021   INFLUENZA VACCINE  11/25/2021   HPV VACCINES  Aged Out    Patient Care Team: Fisher, Donald E, MD as PCP - General (Family Medicine)  Review of Systems  Constitutional:  Negative for fatigue and fever.  Respiratory:  Negative for  cough and shortness of breath.   Cardiovascular:  Negative for chest pain and leg swelling.  Gastrointestinal:  Negative for abdominal pain.  Neurological:  Negative for dizziness and headaches.      Objective    Blood pressure 124/86, pulse 75, temperature 98.6 F (37 C), temperature source Oral, height 5' 4" (1.626 m), SpO2 98 %.   Physical Exam Constitutional:      General: She is awake.     Appearance: She is well-developed.  HENT:     Head: Normocephalic.  Eyes:     Conjunctiva/sclera: Conjunctivae normal.  Cardiovascular:     Rate and Rhythm: Normal rate and regular rhythm.     Heart sounds: Normal heart sounds.  Pulmonary:     Effort: Pulmonary effort is normal.     Breath sounds: Normal breath sounds.  Skin:    General: Skin is warm.  Neurological:     Mental Status: She is alert and oriented to person, place, and time.  Psychiatric:        Attention and Perception: Attention normal.        Mood and Affect: Mood normal.        Speech: Speech normal.        Behavior: Behavior is cooperative.     Depression Screen    11/28/2021    8:21 AM  PHQ 2/9 Scores  PHQ - 2 Score 0  PHQ- 9 Score 0   No results found for any visits on 11/28/21.  Assessment & Plan      Problem List Items Addressed This Visit       Other   Anemia - Primary    Dx in ED 4/23, microcytic tx as iron def anemia Pt has been consistent w/ supplementation Will recheck cbc, iron      Relevant Orders   CBC w/Diff/Platelet   Iron, TIBC and Ferritin Panel   Folate   Vitamin B12   Comprehensive Metabolic Panel (CMET)     Return in about 6 months (around 05/31/2022) for CPE.     I, Lindsay Drubel, PA-C have reviewed all documentation for this visit. The documentation on  11/28/2021 for the exam, diagnosis, procedures, and orders are all accurate and complete.  Lindsay Drubel, PA-C [facility] [address] [place], [place], [address] Office: [phone] Fax:  [phone]   [facility]  

## 2021-11-28 ENCOUNTER — Encounter: Payer: Self-pay | Admitting: Physician Assistant

## 2021-11-28 ENCOUNTER — Ambulatory Visit (INDEPENDENT_AMBULATORY_CARE_PROVIDER_SITE_OTHER): Payer: BC Managed Care – PPO | Admitting: Physician Assistant

## 2021-11-28 VITALS — BP 124/86 | HR 75 | Temp 98.6°F | Ht 64.0 in

## 2021-11-28 DIAGNOSIS — D649 Anemia, unspecified: Secondary | ICD-10-CM | POA: Insufficient documentation

## 2021-11-28 NOTE — Assessment & Plan Note (Signed)
Dx in ED 4/23, microcytic tx as iron def anemia Pt has been consistent w/ supplementation Will recheck cbc, iron

## 2021-11-29 LAB — COMPREHENSIVE METABOLIC PANEL
ALT: 14 IU/L (ref 0–32)
AST: 10 IU/L (ref 0–40)
Albumin/Globulin Ratio: 1.8 (ref 1.2–2.2)
Albumin: 4.6 g/dL (ref 3.9–4.9)
Alkaline Phosphatase: 62 IU/L (ref 44–121)
BUN/Creatinine Ratio: 16 (ref 9–23)
BUN: 10 mg/dL (ref 6–24)
Bilirubin Total: 0.2 mg/dL (ref 0.0–1.2)
CO2: 21 mmol/L (ref 20–29)
Calcium: 9.5 mg/dL (ref 8.7–10.2)
Chloride: 103 mmol/L (ref 96–106)
Creatinine, Ser: 0.61 mg/dL (ref 0.57–1.00)
Globulin, Total: 2.5 g/dL (ref 1.5–4.5)
Glucose: 100 mg/dL — ABNORMAL HIGH (ref 70–99)
Potassium: 4.2 mmol/L (ref 3.5–5.2)
Sodium: 139 mmol/L (ref 134–144)
Total Protein: 7.1 g/dL (ref 6.0–8.5)
eGFR: 115 mL/min/{1.73_m2} (ref >59)

## 2021-11-29 LAB — CBC WITH DIFFERENTIAL/PLATELET
Basophils Absolute: 0 10*3/uL (ref 0.0–0.2)
Basos: 0 %
EOS (ABSOLUTE): 0.3 10*3/uL (ref 0.0–0.4)
Eos: 4 %
Hematocrit: 39.9 % (ref 34.0–46.6)
Hemoglobin: 12.9 g/dL (ref 11.1–15.9)
Immature Grans (Abs): 0 10*3/uL (ref 0.0–0.1)
Immature Granulocytes: 0 %
Lymphocytes Absolute: 2.5 10*3/uL (ref 0.7–3.1)
Lymphs: 37 %
MCH: 27 pg (ref 26.6–33.0)
MCHC: 32.3 g/dL (ref 31.5–35.7)
MCV: 84 fL (ref 79–97)
Monocytes Absolute: 0.4 10*3/uL (ref 0.1–0.9)
Monocytes: 6 %
Neutrophils Absolute: 3.5 10*3/uL (ref 1.4–7.0)
Neutrophils: 53 %
Platelets: 277 10*3/uL (ref 150–450)
RBC: 4.78 x10E6/uL (ref 3.77–5.28)
RDW: 16.9 % — ABNORMAL HIGH (ref 11.7–15.4)
WBC: 6.7 10*3/uL (ref 3.4–10.8)

## 2021-11-29 LAB — IRON,TIBC AND FERRITIN PANEL
Ferritin: 24 ng/mL (ref 15–150)
Iron Saturation: 51 % (ref 15–55)
Iron: 190 ug/dL — ABNORMAL HIGH (ref 27–159)
Total Iron Binding Capacity: 371 ug/dL (ref 250–450)
UIBC: 181 ug/dL (ref 131–425)

## 2021-11-29 LAB — VITAMIN B12: Vitamin B-12: 829 pg/mL (ref 232–1245)

## 2021-11-29 LAB — FOLATE: Folate: >20 ng/mL (ref >3.0)

## 2022-02-23 ENCOUNTER — Encounter: Payer: Self-pay | Admitting: Family Medicine

## 2022-02-23 ENCOUNTER — Ambulatory Visit (INDEPENDENT_AMBULATORY_CARE_PROVIDER_SITE_OTHER): Payer: BC Managed Care – PPO | Admitting: Family Medicine

## 2022-02-23 VITALS — BP 111/67 | HR 64 | Temp 98.5°F | Resp 16 | Wt 233.3 lb

## 2022-02-23 DIAGNOSIS — H109 Unspecified conjunctivitis: Secondary | ICD-10-CM | POA: Diagnosis not present

## 2022-02-23 MED ORDER — OFLOXACIN 0.3 % OP SOLN: 2.0000 [drp] | Freq: Four times a day (QID) | OPHTHALMIC | 0 refills | Status: AC

## 2022-02-23 NOTE — Progress Notes (Signed)
I,Sulibeya S Dimas,acting as a scribe for Shirlee Latch, MD.,have documented all relevant documentation on the behalf of Shirlee Latch, MD,as directed by  Shirlee Latch, MD while in the presence of Shirlee Latch, MD.     Established patient visit   Patient: Candace Mahoney   DOB: June 15, 1980   41 y.o. Female  MRN: [id number] Visit Date: 02/23/2022  Today's healthcare provider: Shirlee Latch, MD   Chief Complaint  Patient presents with   Eye Drainage   Subjective    Eye Problem  The right eye is affected. This is a new problem. The current episode started today. The problem occurs constantly. There was no injury mechanism. The pain is mild. There is No known exposure to pink eye. She Wears contacts. Associated symptoms include blurred vision, an eye discharge, eye redness and itching. Pertinent negatives include no fever, nausea or vomiting. She has tried nothing for the symptoms.    Feels gritty, no vision changes, wears contacts occasionally (last worn 2 days ago). No pain with eye movements or vision changes   Medications: Outpatient Medications Prior to Visit  Medication Sig   [DISCONTINUED] Cetirizine HCl (ZYRTEC ALLERGY) 10 MG CAPS Take 1 tablet by mouth daily as needed.   [DISCONTINUED] loratadine (CLARITIN) 10 MG tablet Take 1 tablet (10 mg total) by mouth daily. Take 1 tablet in the morning. As needed for itching.   [DISCONTINUED] mupirocin ointment (BACTROBAN) 2 % Apply 1 application topically 2 (two) times daily.   [DISCONTINUED] ranitidine (ZANTAC) 150 MG capsule Take 1 capsule (150 mg total) by mouth 2 (two) times daily.   No facility-administered medications prior to visit.    Review of Systems  Constitutional:  Negative for chills and fever.  HENT:  Negative for congestion and rhinorrhea.   Eyes:  Positive for blurred vision, pain, discharge, redness, itching and visual disturbance.  Gastrointestinal:  Negative for nausea and vomiting.        Objective    BP 111/67 (BP Location: Left Arm, Patient Position: Sitting, Cuff Size: Large)   Pulse 64   Temp 98.5 F (36.9 C) (Oral)   Resp 16   Wt 233 lb 4.8 oz (105.8 kg)   BMI 40.05 kg/m  BP Readings from Last 3 Encounters:  02/23/22 111/67  11/28/21 124/86  12/30/15 121/68   Wt Readings from Last 3 Encounters:  02/23/22 233 lb 4.8 oz (105.8 kg)  12/30/15 190 lb (86.2 kg)  01/17/15 191 lb 9.6 oz (86.9 kg)      Physical Exam Vitals reviewed.  Constitutional:      General: She is not in acute distress.    Appearance: She is well-developed.  HENT:     Head: Normocephalic and atraumatic.  Eyes:     General: No scleral icterus.    Extraocular Movements: Extraocular movements intact.     Conjunctiva/sclera:     Right eye: Right conjunctiva is injected. No hemorrhage.    Left eye: Left conjunctiva is not injected.     Pupils: Pupils are equal, round, and reactive to light.  Cardiovascular:     Rate and Rhythm: Normal rate and regular rhythm.  Pulmonary:     Effort: Pulmonary effort is normal. No respiratory distress.  Skin:    General: Skin is warm and dry.  Neurological:     Mental Status: She is alert and oriented to person, place, and time.  Psychiatric:        Behavior: Behavior normal.       No  results found for any visits on 02/23/22.  Assessment & Plan     1. Bacterial conjunctivitis of right eye - new problem - appears consistent bacterial conjunctivitis - is a contact wearer - will cover for pseudomonas - treat with ofloxacin drops - discussed infection precautions and return to work - discussed return precautions   Return if symptoms worsen or fail to improve.      I, Lavon Paganini, MD, have reviewed all documentation for this visit. The documentation on 02/23/22 for the exam, diagnosis, procedures, and orders are all accurate and complete.   Darrel Baroni, Dionne Bucy, MD, MPH [facility]

## 2022-06-05 ENCOUNTER — Encounter: Payer: BC Managed Care – PPO | Admitting: Physician Assistant

## 2022-06-23 ENCOUNTER — Other Ambulatory Visit: Payer: Self-pay | Admitting: Family Medicine

## 2022-07-07 NOTE — Progress Notes (Unsigned)
Candace Mahoney,[name],acting as a Education administrator for [employer], PA-C.,have documented all relevant documentation on the behalf of Candace Kirschner, PA-C,as directed by  Candace Kirschner, PA-C while in the presence of Candace Kirschner, PA-C.   Complete physical exam   Patient: Candace Mahoney   DOB: Sep 14, 1980   42 y.o. Female  MRN: [id number] Visit Date: 07/08/2022  Today's healthcare provider: [name], PA-C   No chief complaint on file.  Subjective    Candace Mahoney is a 42 y.o. female who presents today for a complete physical exam.  She reports consuming a {diet types:17450} diet. {Exercise:19826} She generally feels {well/fairly well/poorly:18703}. She reports sleeping {well/fairly well/poorly:18703}. She {does/does not:200015} have additional problems to discuss today.  HPI  ***  Past Medical History:  Diagnosis Date   Blood transfusion without reported diagnosis    H/O: pneumonia [id number]   Complicated by septic shock when living in [place] per patient report.    History of chicken pox    Past Surgical History:  Procedure Laterality Date   CESAREAN SECTION     CHOLECYSTECTOMY  04/28/2007   [facility]   OVARIAN CYST REMOVAL     Partial Fallopian tube removal     Social History   Socioeconomic History   Marital status: Married    Spouse name: Not on file   Number of children: Not on file   Years of education: Not on file   Highest education level: Not on file  Occupational History   Occupation: Employed    Comment: Full- Time. Works at [employer]   Smoking status: Never   Smokeless tobacco: Never  Substance and Sexual Activity   Alcohol use: No    Alcohol/week: 0.0 standard drinks of alcohol   Drug use: No   Sexual activity: Not on file  Other Topics Concern   Not on file  Social History Narrative   Not on file   Social Determinants of Health   Financial Resource Strain: Not on file  Food Insecurity: Not on  file  Transportation Needs: Not on file  Physical Activity: Not on file  Stress: Not on file  Social Connections: Not on file  Intimate Partner Violence: Not on file   Family Status  Relation Name Status   Mother  Alive   MGM  Alive   MGF  Deceased at age 27   Father  Alive   Psychiatrist  (Not Specified)   Neg Hx  (Not Specified)   Family History  Problem Relation Age of Onset   Hypertension Mother    Diabetes Maternal Grandmother    Stroke Maternal Grandmother    Lung cancer Maternal Grandfather        smoker   Lung cancer Paternal Uncle        smoker   Mesothelioma Paternal Uncle    Kidney disease Neg Hx    Kidney cancer Neg Hx    Prostate cancer Neg Hx    Allergies  Allergen Reactions   Penicillins Rash and Hives   Sulfa Antibiotics Rash    Patient Care Team: Birdie Sons, MD as PCP - General (Family Medicine)   Medications: No outpatient medications prior to visit.   No facility-administered medications prior to visit.    Review of Systems  {Labs  Heme  Chem  Endocrine  Serology  Results Review (optional):23779}  Objective    There were no vitals taken for this visit. {Show previous vital signs (  optional):23777}   Physical Exam  ***  Last depression screening scores    11/28/2021    8:21 AM  PHQ 2/9 Scores  PHQ - 2 Score 0  PHQ- 9 Score 0   Last fall risk screening    11/28/2021    8:21 AM  Fall Risk   Falls in the past year? 0  Number falls in past yr: 0  Injury with Fall? 0  Risk for fall due to : No Fall Risks   Last Audit-C alcohol use screening    11/28/2021    8:21 AM  Alcohol Use Disorder Test (AUDIT)  1. How often do you have a drink containing alcohol? 1  2. How many drinks containing alcohol do you have on a typical day when you are drinking? 0  3. How often do you have six or more drinks on one occasion? 0  AUDIT-C Score 1   A score of 3 or more in women, and 4 or more in men indicates increased risk for alcohol  abuse, EXCEPT if all of the points are from question 1   No results found for any visits on 07/08/22.  Assessment & Plan    Routine Health Maintenance and Physical Exam  Exercise Activities and Dietary recommendations  Goals   None     Immunization History  Administered Date(s) Administered   MMR 05/25/2011   Tdap 05/25/2011    Health Maintenance  Topic Date Due   COVID-19 Vaccine (1) Never done   HIV Screening  Never done   Hepatitis C Screening  Never done   PAP SMEAR-Modifier  07/28/2016   DTaP/Tdap/Td (2 - Td or Tdap) 05/24/2021   INFLUENZA VACCINE  07/26/2022 (Originally 11/25/2021)   HPV VACCINES  Aged Out    Discussed health benefits of physical activity, and encouraged her to engage in regular exercise appropriate for her age and condition.  ***  No follow-ups on file.     {provider attestation***:1}   Candace Kirschner, PA-C  [place] [phone] (phone) [phone] (fax)  [place]

## 2022-07-08 ENCOUNTER — Ambulatory Visit
Admission: RE | Admit: 2022-07-08 | Discharge: 2022-07-08 | Disposition: A | Discharge status: Home / Self Care | Payer: BC Managed Care – PPO | Source: Ambulatory Visit | Attending: Physician Assistant | Admitting: Physician Assistant

## 2022-07-08 ENCOUNTER — Other Ambulatory Visit (HOSPITAL_COMMUNITY)
Admission: RE | Admit: 2022-07-08 | Discharge: 2022-07-08 | Disposition: A | Discharge status: Home / Self Care | Payer: BC Managed Care – PPO | Source: Ambulatory Visit | Attending: Physician Assistant | Admitting: Physician Assistant

## 2022-07-08 ENCOUNTER — Ambulatory Visit (INDEPENDENT_AMBULATORY_CARE_PROVIDER_SITE_OTHER): Payer: BC Managed Care – PPO | Admitting: Physician Assistant

## 2022-07-08 ENCOUNTER — Encounter: Payer: Self-pay | Admitting: Physician Assistant

## 2022-07-08 VITALS — BP 118/81 | HR 89 | Temp 98.1°F | Wt 225.0 lb

## 2022-07-08 DIAGNOSIS — Z1231 Encounter for screening mammogram for malignant neoplasm of breast: Secondary | ICD-10-CM | POA: Diagnosis not present

## 2022-07-08 DIAGNOSIS — Z0000 Encounter for general adult medical examination without abnormal findings: Secondary | ICD-10-CM | POA: Diagnosis not present

## 2022-07-08 DIAGNOSIS — R400 Somnolence: Secondary | ICD-10-CM | POA: Diagnosis not present

## 2022-07-08 DIAGNOSIS — Z124 Encounter for screening for malignant neoplasm of cervix: Secondary | ICD-10-CM

## 2022-07-08 DIAGNOSIS — R739 Hyperglycemia, unspecified: Secondary | ICD-10-CM

## 2022-07-08 NOTE — Assessment & Plan Note (Signed)
Will check a1c 

## 2022-07-08 NOTE — Assessment & Plan Note (Signed)
Discussed testing tsh/t4, cbc, cmp, vit d, vit b12 A multivitamin is fine to take Discussed sleep study and what is involved, pt would like to hold off for now

## 2022-07-09 LAB — LIPID PANEL
Chol/HDL Ratio: 3.9 ratio (ref 0.0–4.4)
Cholesterol, Total: 195 mg/dL (ref 100–199)
HDL: 50 mg/dL (ref >39)
LDL Chol Calc (NIH): 123 mg/dL — ABNORMAL HIGH (ref 0–99)
Triglycerides: 124 mg/dL (ref 0–149)
VLDL Cholesterol Cal: 22 mg/dL (ref 5–40)

## 2022-07-09 LAB — COMPREHENSIVE METABOLIC PANEL
ALT: 12 IU/L (ref 0–32)
AST: 13 IU/L (ref 0–40)
Albumin/Globulin Ratio: 2 (ref 1.2–2.2)
Albumin: 4.5 g/dL (ref 3.9–4.9)
Alkaline Phosphatase: 79 IU/L (ref 44–121)
BUN/Creatinine Ratio: 12 (ref 9–23)
BUN: 8 mg/dL (ref 6–24)
Bilirubin Total: <0.2 mg/dL (ref 0.0–1.2)
CO2: 22 mmol/L (ref 20–29)
Calcium: 9.8 mg/dL (ref 8.7–10.2)
Chloride: 102 mmol/L (ref 96–106)
Creatinine, Ser: 0.69 mg/dL (ref 0.57–1.00)
Globulin, Total: 2.3 g/dL (ref 1.5–4.5)
Glucose: 98 mg/dL (ref 70–99)
Potassium: 4.6 mmol/L (ref 3.5–5.2)
Sodium: 140 mmol/L (ref 134–144)
Total Protein: 6.8 g/dL (ref 6.0–8.5)
eGFR: 111 mL/min/{1.73_m2} (ref >59)

## 2022-07-09 LAB — CYTOLOGY - PAP
Adequacy: ABSENT
Comment: NEGATIVE
Diagnosis: NEGATIVE
High risk HPV: NEGATIVE

## 2022-07-09 LAB — HEMOGLOBIN A1C
Est. average glucose Bld gHb Est-mCnc: 111 mg/dL
Hgb A1c MFr Bld: 5.5 % (ref 4.8–5.6)

## 2022-07-09 LAB — TSH: TSH: 1.71 u[IU]/mL (ref 0.450–4.500)

## 2022-07-09 LAB — VITAMIN B12: Vitamin B-12: 778 pg/mL (ref 232–1245)

## 2022-07-09 LAB — VITAMIN D 25 HYDROXY (VIT D DEFICIENCY, FRACTURES): Vit D, 25-Hydroxy: 11.3 ng/mL — ABNORMAL LOW (ref 30.0–100.0)

## 2022-07-10 ENCOUNTER — Telehealth: Payer: Self-pay

## 2022-07-10 NOTE — Telephone Encounter (Signed)
Opened in error

## 2023-03-08 ENCOUNTER — Ambulatory Visit: Payer: Self-pay | Admitting: *Deleted

## 2023-03-08 NOTE — Telephone Encounter (Signed)
  Chief Complaint: Dysuria Symptoms: Pain with urination, bladder pressure,frequency, cloudy urine Frequency: Thursday Pertinent Negatives: Patient denies fever Disposition: [] ED /[] Urgent Care (no appt availability in office) / [x] Appointment(In office/virtual)/ []  [facility]/ [] Home Care/ [] Refused Recommended Disposition /[] [facility]/ []  Follow-up with PCP Additional Notes: Appt secured for tomorrow AM. Care advise provided, pt verbalizes understanding. Reason for Disposition  Side (flank) or lower back pain present  Answer Assessment - Initial Assessment Questions 1. SYMPTOM: "What's the main symptom you're concerned about?" (e.g., frequency, incontinence)     Lower back pain, dysuria 2. ONSET: "When did the    start?"     Thursday 3. PAIN: "Is there any pain?" If Yes, ask: "How bad is it?" (Scale: 1-10; mild, moderate, severe)     Yes, moderate 4. CAUSE: "What do you think is causing the symptoms?"     UTI 5. OTHER SYMPTOMS: "Do you have any other symptoms?" (e.g., blood in urine, fever, flank pain, pain with urination)     Bladder pressure, frequency,lower back pain, cloudy  Protocols used: Urinary Symptoms-A-AH

## 2023-03-09 ENCOUNTER — Encounter: Payer: Self-pay | Admitting: Family Medicine

## 2023-03-09 ENCOUNTER — Ambulatory Visit (INDEPENDENT_AMBULATORY_CARE_PROVIDER_SITE_OTHER): Payer: BC Managed Care – PPO | Admitting: Family Medicine

## 2023-03-09 VITALS — BP 124/64 | HR 104 | Resp 18 | Ht 64.0 in | Wt 233.8 lb

## 2023-03-09 DIAGNOSIS — R109 Unspecified abdominal pain: Secondary | ICD-10-CM | POA: Diagnosis not present

## 2023-03-09 DIAGNOSIS — D508 Other iron deficiency anemias: Secondary | ICD-10-CM | POA: Diagnosis not present

## 2023-03-09 DIAGNOSIS — R300 Dysuria: Secondary | ICD-10-CM

## 2023-03-09 DIAGNOSIS — N3000 Acute cystitis without hematuria: Secondary | ICD-10-CM | POA: Insufficient documentation

## 2023-03-09 LAB — POCT URINALYSIS DIPSTICK
Bilirubin, UA: NEGATIVE
Glucose, UA: NEGATIVE
Ketones, UA: NEGATIVE
Leukocytes, UA: NEGATIVE
Nitrite, UA: NEGATIVE
Protein, UA: POSITIVE — AB
Spec Grav, UA: 1.025 (ref 1.010–1.025)
Urobilinogen, UA: 0.2 U/dL
pH, UA: 6 (ref 5.0–8.0)

## 2023-03-09 MED ORDER — NITROFURANTOIN MONOHYD MACRO 100 MG PO CAPS: 100.0000 mg | ORAL_CAPSULE | Freq: Two times a day (BID) | ORAL | 0 refills | Status: AC

## 2023-03-09 NOTE — Progress Notes (Signed)
Established patient visit   Patient: Candace Mahoney   DOB: 23-May-1980   42 y.o. Female  MRN: [id number] Visit Date: 03/09/2023  Today's healthcare provider: Ronnald Ramp, MD   Chief Complaint  Patient presents with   Dysuria   Flank Pain    Started yesterday   Subjective     HPI     Flank Pain    Additional comments: Started yesterday        Comments   Started last week      Last edited by Ashok Cordia, CMA on 03/09/2023  8:16 AM.       Discussed the use of AI scribe software for clinical note transcription with the patient, who gave verbal consent to proceed.  History of Present Illness   The patient, a 42 year old individual, presented with a chief complaint of dysuria and urinary frequency, which began approximately five days prior to the consultation. The patient reported a low volume of urine output and increasing discomfort during urination, despite attempts to self-treat with cranberry products. She denied hematuria but noted the onset of back pain the day before the consultation, which was unusual for her. The patient also reported experiencing intermittent chills but denied fever or lower abdominal pain. She had taken Tylenol for the discomfort, which provided some relief.  The patient's medical history includes infrequent urinary tract infections (UTIs), which she typically managed with cranberry products without requiring medical intervention. However, the current symptoms were more severe than previous episodes. The patient's urine analysis showed trace protein but no nitrates. The patient reported no new medications and no known allergies.  The patient's symptoms, combined with the presence of protein in the urine, were suggestive of a urinary tract infection.         Past Medical History:  Diagnosis Date   Blood transfusion without reported diagnosis    H/O: pneumonia 12/20/2014   Complicated by septic shock when living in  [place] per patient report.    History of chicken pox     Medications: Outpatient Medications Prior to Visit  Medication Sig   fexofenadine (ALLEGRA) 180 MG tablet Take 180 mg by mouth daily. (Patient not taking: Reported on 03/09/2023)   No facility-administered medications prior to visit.    Review of Systems  Last metabolic panel Lab Results  Component Value Date   GLUCOSE 98 07/08/2022   NA 140 07/08/2022   K 4.6 07/08/2022   CL 102 07/08/2022   CO2 22 07/08/2022   BUN 8 07/08/2022   CREATININE 0.69 07/08/2022   EGFR 111 07/08/2022   CALCIUM 9.8 07/08/2022   PROT 6.8 07/08/2022   ALBUMIN 4.5 07/08/2022   LABGLOB 2.3 07/08/2022   AGRATIO 2.0 07/08/2022   BILITOT <0.2 07/08/2022   ALKPHOS 79 07/08/2022   AST 13 07/08/2022   ALT 12 07/08/2022        Objective    BP 124/64   Pulse (!) 104   Resp 18   Ht 5\' 4"  (1.626 m)   Wt 233 lb 12.8 oz (106.1 kg)   SpO2 99%   BMI 40.13 kg/m  BP Readings from Last 3 Encounters:  03/09/23 124/64  07/08/22 118/81  02/23/22 111/67   Wt Readings from Last 3 Encounters:  03/09/23 233 lb 12.8 oz (106.1 kg)  07/08/22 225 lb (102.1 kg)  02/23/22 233 lb 4.8 oz (105.8 kg)          Physical Exam  Physical Exam   CHEST:  Lungs clear to auscultation, no wheezing, no crackles. CARDIOVASCULAR: Heart sounds normal, no murmurs. ABDOMEN: Abdomen soft, non-tender on palpation, no pain on percussion, no CVA tenderness. Bowel sounds normal.       Results for orders placed or performed in visit on 03/09/23  POCT Urinalysis Dipstick  Result Value Ref Range   Color, UA Yellow    Clarity, UA Clear    Glucose, UA Negative Negative   Bilirubin, UA Negative    Ketones, UA Negative    Spec Grav, UA 1.025 1.010 - 1.025   Blood, UA Small    pH, UA 6.0 5.0 - 8.0   Protein, UA Positive (A) Negative   Urobilinogen, UA 0.2 0.2 or 1.0 E.U./dL   Nitrite, UA Negative    Leukocytes, UA Negative Negative   Appearance     Odor       Assessment & Plan     Problem List Items Addressed This Visit       Genitourinary   Acute cystitis without hematuria - Primary    Presents with dysuria, frequency, and oliguria since Friday. Reports chills but afebrile, and back pain likely from prolonged sitting. Urinalysis showed trace protein, no nitrates. Symptoms and urinalysis consistent with UTI. Allergic to penicillin and sulfa drugs. Decision to treat with Macrobid (nitrofurantoin) due to allergies. Discussed risks of diarrhea and altering gut flora, benefits of symptom relief, and importance of hydration. - Prescribe Macrobid 100 mg, twice daily for 7 days - Encourage hydration - Advise to return if symptoms persist after 7 days - Order BMP - Order CBC         Relevant Medications   nitrofurantoin, macrocrystal-monohydrate, (MACROBID) 100 MG capsule   Other Relevant Orders   POCT Urinalysis Dipstick (Completed)   CBC   Basic Metabolic Panel (BMET)   Other Visit Diagnoses     Flank pain                  Return in about 10 days (around 03/19/2023), or if symptoms worsen or fail to improve.         Ronnald Ramp, MD  [facility] [phone] (phone) [phone] (fax)  [facility]

## 2023-03-09 NOTE — Assessment & Plan Note (Signed)
Presents with dysuria, frequency, and oliguria since Friday. Reports chills but afebrile, and back pain likely from prolonged sitting. Urinalysis showed trace protein, no nitrates. Symptoms and urinalysis consistent with UTI. Allergic to penicillin and sulfa drugs. Decision to treat with Macrobid (nitrofurantoin) due to allergies. Discussed risks of diarrhea and altering gut flora, benefits of symptom relief, and importance of hydration. - Prescribe Macrobid 100 mg, twice daily for 7 days - Encourage hydration - Advise to return if symptoms persist after 7 days - Order BMP - Order CBC

## 2023-03-09 NOTE — Patient Instructions (Signed)
VISIT SUMMARY:  During today's visit, we discussed your recent symptoms of painful and frequent urination, which began five days ago. You also reported back pain and intermittent chills. Based on your symptoms and urinalysis results, we diagnosed you with a urinary tract infection (UTI).  YOUR PLAN:  -URINARY TRACT INFECTION (UTI): A urinary tract infection is an infection in any part of your urinary system. We will treat this with Macrobid (nitrofurantoin) 100 mg, to be taken twice daily for 7 days. It's important to stay hydrated. If your symptoms do not improve after 7 days, please return for further evaluation.  -FLANK PAIN: Your back pain is likely due to prolonged sitting. We will perform blood work to rule out any kidney issues or inflammation. This will include a Basic Metabolic Panel (BMP) and a Complete Blood Count (CBC).  -GENERAL HEALTH MAINTENANCE: We discussed the need for a new primary care provider (PCP) since your previous one left. You expressed interest in Dr. Roxan Hockey, and we will assign them as your new PCP.  INSTRUCTIONS:  Please follow up if your symptoms persist after 7 days of treatment. Additionally, we will perform blood work to check your kidney function and white blood cell count.

## 2023-03-10 LAB — CBC
Hematocrit: 29.9 % — ABNORMAL LOW (ref 34.0–46.6)
Hemoglobin: 8.6 g/dL — ABNORMAL LOW (ref 11.1–15.9)
MCH: 20.3 pg — ABNORMAL LOW (ref 26.6–33.0)
MCHC: 28.8 g/dL — ABNORMAL LOW (ref 31.5–35.7)
MCV: 71 fL — ABNORMAL LOW (ref 79–97)
Platelets: 431 10*3/uL (ref 150–450)
RBC: 4.24 x10E6/uL (ref 3.77–5.28)
RDW: 17.1 % — ABNORMAL HIGH (ref 11.7–15.4)
WBC: 8.5 10*3/uL (ref 3.4–10.8)

## 2023-03-10 LAB — BASIC METABOLIC PANEL WITH GFR
BUN/Creatinine Ratio: 16 (ref 9–23)
BUN: 10 mg/dL (ref 6–24)
CO2: 19 mmol/L — ABNORMAL LOW (ref 20–29)
Calcium: 8.9 mg/dL (ref 8.7–10.2)
Chloride: 101 mmol/L (ref 96–106)
Creatinine, Ser: 0.61 mg/dL (ref 0.57–1.00)
Glucose: 77 mg/dL (ref 70–99)
Potassium: 4.7 mmol/L (ref 3.5–5.2)
Sodium: 138 mmol/L (ref 134–144)
eGFR: 114 mL/min/{1.73_m2}

## 2023-03-12 NOTE — Addendum Note (Signed)
Addended by: Bing Neighbors on: 03/12/2023 12:38 PM   Modules accepted: Orders

## 2023-03-15 ENCOUNTER — Telehealth: Payer: Self-pay

## 2023-03-15 NOTE — Telephone Encounter (Signed)
Pt given lab results per notes of Dr. Roxan Hockey from 03/09/23 on 03/15/23. Pt verbalized understanding.   Ronnald Ramp, MD 03/12/2023 12:38 PM EST     Hemoglobin is low at 8.6 along with low MCV indicative of iron deficiency anemia Recommend starting iron supplement and rechecking levels with CBC in 6 weeks     Metabolic panel within normal limits, normal kidney function, normal calcium, sodium and potassium

## 2023-03-17 ENCOUNTER — Telehealth: Payer: Self-pay

## 2023-03-17 NOTE — Telephone Encounter (Signed)
 Copied from CRM [id number]. Topic: General - Inquiry >> Mar 17, 2023  3:59 PM Lennox Pippins wrote: Someone named Dorathy Daft states she received a call on phone #  [phone] for patient Adel and Dorathy Daft states that is a wrong number for patient. Dorathy Daft states she does not know Development worker, community.

## 2023-10-25 ENCOUNTER — Ambulatory Visit: Payer: Self-pay

## 2023-10-25 ENCOUNTER — Ambulatory Visit
Admission: EM | Admit: 2023-10-25 | Discharge: 2023-10-25 | Disposition: A | Discharge status: Home / Self Care | Source: Ambulatory Visit | Attending: Physician Assistant | Admitting: Physician Assistant

## 2023-10-25 ENCOUNTER — Encounter: Payer: Self-pay | Admitting: Emergency Medicine

## 2023-10-25 DIAGNOSIS — B379 Candidiasis, unspecified: Secondary | ICD-10-CM | POA: Diagnosis not present

## 2023-10-25 DIAGNOSIS — N3000 Acute cystitis without hematuria: Secondary | ICD-10-CM | POA: Diagnosis not present

## 2023-10-25 LAB — URINALYSIS, W/ REFLEX TO CULTURE (INFECTION SUSPECTED)
Bilirubin Urine: NEGATIVE
Glucose, UA: NEGATIVE mg/dL
Ketones, ur: NEGATIVE mg/dL
Nitrite: NEGATIVE
Protein, ur: NEGATIVE mg/dL
Specific Gravity, Urine: >1.03 — ABNORMAL HIGH (ref 1.005–1.030)
pH: 5.5 (ref 5.0–8.0)

## 2023-10-25 MED ORDER — NITROFURANTOIN MONOHYD MACRO 100 MG PO CAPS
100.0000 mg | ORAL_CAPSULE | Freq: Two times a day (BID) | ORAL | 0 refills | Status: AC
Start: 2023-10-25 — End: ?

## 2023-10-25 MED ORDER — FLUCONAZOLE 150 MG PO TABS
ORAL_TABLET | ORAL | 0 refills | Status: AC
Start: 2023-10-25 — End: ?

## 2023-10-25 MED ORDER — PHENAZOPYRIDINE HCL 200 MG PO TABS: 200.0000 mg | ORAL_TABLET | Freq: Three times a day (TID) | ORAL | 0 refills | Status: AC

## 2023-10-25 NOTE — ED Triage Notes (Signed)
Pt presents with dysuria and urinary frequency x 1 week.  

## 2023-10-25 NOTE — ED Provider Notes (Signed)
 [facility]    CSN: [id number] Arrival date & time: 10/25/23  1336      History   Chief Complaint Chief Complaint  Patient presents with   Urinary Frequency   Dysuria    HPI Candace Mahoney is a 43 y.o. female presenting for urinary frequency and urgency for the past few days. Reports burning with urination that just started. Reports a lot of bladder pressure. States she had vaginal itching a little discharge a few days ago. Denies fever, fatigue, abdominal pain, flank pain, hematuria, or vaginal odor. LMP 10/04/23.    HPI  Past Medical History:  Diagnosis Date   Blood transfusion without reported diagnosis    H/O: pneumonia 12/20/2014   Complicated by septic shock when living in [place] per patient report.    History of chicken pox     Patient Active Problem List   Diagnosis Date Noted   Acute cystitis without hematuria 03/09/2023   Hyperglycemia 07/08/2022   Daytime sleepiness 07/08/2022   Anemia 11/28/2021   Allergic rhinitis 12/20/2014   Palpitation 12/20/2014    Past Surgical History:  Procedure Laterality Date   CESAREAN SECTION     CHOLECYSTECTOMY  04/28/2007   [facility]    OVARIAN CYST REMOVAL     Partial Fallopian tube removal      OB History     Gravida  5   Para  4   Term      Preterm      AB  1   Living         SAB      IAB      Ectopic      Multiple      Live Births               Home Medications    Prior to Admission medications   Medication Sig Start Date End Date Taking? Authorizing Provider  fluconazole (DIFLUCAN) 150 MG tablet Take 1 tab po q72 hr 10/25/23  Yes Arvis Huxley B, PA-C  nitrofurantoin , macrocrystal-monohydrate, (MACROBID ) 100 MG capsule Take 1 capsule (100 mg total) by mouth 2 (two) times daily. 10/25/23  Yes Arvis Huxley NOVAK, PA-C  phenazopyridine (PYRIDIUM) 200 MG tablet Take 1 tablet (200 mg total) by mouth 3 (three) times daily. 10/25/23  Yes Arvis Huxley NOVAK, PA-C   fexofenadine (ALLEGRA) 180 MG tablet Take 180 mg by mouth daily. Patient not taking: Reported on 03/09/2023    [provider]    Family History Family History  Problem Relation Age of Onset   Hypertension Mother    Lung cancer Paternal Uncle        smoker   Mesothelioma Paternal Uncle    Breast cancer Maternal Grandmother    Diabetes Maternal Grandmother    Stroke Maternal Grandmother    Lung cancer Maternal Grandfather        smoker   Kidney disease Neg Hx    Kidney cancer Neg Hx    Prostate cancer Neg Hx     Social History Social History   Tobacco Use   Smoking status: Never   Smokeless tobacco: Never  Vaping Use   Vaping status: Never Used  Substance Use Topics   Alcohol use: No    Alcohol/week: 0.0 standard drinks of alcohol   Drug use: No     Allergies   Penicillins and Sulfa antibiotics   Review of Systems Review of Systems  Constitutional:  Negative for chills and fever.  Gastrointestinal:  Negative for abdominal pain, diarrhea, nausea and vomiting.  Genitourinary:  Positive for dysuria, frequency and urgency. Negative for decreased urine volume, flank pain, hematuria, pelvic pain, vaginal bleeding, vaginal discharge and vaginal pain.  Musculoskeletal:  Negative for back pain.  Skin:  Negative for rash.     Physical Exam Triage Vital Signs ED Triage Vitals  Encounter Vitals Group     BP      Girls Systolic BP Percentile      Girls Diastolic BP Percentile      Boys Systolic BP Percentile      Boys Diastolic BP Percentile      Pulse      Resp      Temp      Temp src      SpO2      Weight      Height      Head Circumference      Peak Flow      Pain Score      Pain Loc      Pain Education      Exclude from Growth Chart    No data found.  Updated Vital Signs BP 128/83 (BP Location: Right Arm)   Pulse 89   Temp 98.5 F (36.9 C) (Oral)   Resp 16   LMP 10/04/2023   SpO2 98%      Physical Exam Vitals and nursing note  reviewed.  Constitutional:      General: She is not in acute distress.    Appearance: Normal appearance. She is not ill-appearing or toxic-appearing.  HENT:     Head: Normocephalic and atraumatic.   Eyes:     General: No scleral icterus.       Right eye: No discharge.        Left eye: No discharge.     Conjunctiva/sclera: Conjunctivae normal.    Cardiovascular:     Rate and Rhythm: Normal rate and regular rhythm.     Heart sounds: Normal heart sounds.  Pulmonary:     Effort: Pulmonary effort is normal. No respiratory distress.     Breath sounds: Normal breath sounds.  Abdominal:     Palpations: Abdomen is soft.     Tenderness: There is no abdominal tenderness. There is no right CVA tenderness or left CVA tenderness.   Musculoskeletal:     Cervical back: Neck supple.   Skin:    General: Skin is dry.   Neurological:     General: No focal deficit present.     Mental Status: She is alert. Mental status is at baseline.     Motor: No weakness.     Gait: Gait normal.   Psychiatric:        Mood and Affect: Mood normal.        Behavior: Behavior normal.      UC Treatments / Results  Labs (all labs ordered are listed, but only abnormal results are displayed) Labs Reviewed  URINALYSIS, W/ REFLEX TO CULTURE (INFECTION SUSPECTED) - Abnormal; Notable for the following components:      Result Value   APPearance HAZY (*)    Specific Gravity, Urine >1.030 (*)    Hgb urine dipstick SMALL (*)    Leukocytes,Ua SMALL (*)    Bacteria, UA MANY (*)    All other components within normal limits  URINE CULTURE    EKG   Radiology No results found.  Procedures Procedures (including critical care time)  Medications Ordered in UC Medications -  No data to display  Initial Impression / Assessment and Plan / UC Course  I have reviewed the triage vital signs and the nursing notes.  Pertinent labs & imaging results that were available during my care of the patient were reviewed  by me and considered in my medical decision making (see chart for details).   43 y/o female presents for dysuria, urgency and frequency for the past few days.   Vitals stable and normal. Overall well appearing. NAD. No abdominal tenderness. No CVA tenderness.   UA obtained. >1.030 specific gravity, small hgb, small leuks, many bacteria and budding yeast. Urine sent for culture.   UTI and yeast infection. Treating with Macrobid , Diflucan and Pyridium. Advised increasing fluids. Will amend treatment based on culture if necessary. Reviewed return precautions.    Final Clinical Impressions(s) / UC Diagnoses   Final diagnoses:  Acute cystitis without hematuria  Yeast infection     Discharge Instructions      UTI: Based on either symptoms or urinalysis, you may have a urinary tract infection. We will send the urine for culture and call with results in a few days. Begin antibiotics at this time. Your symptoms should be much improved over the next 2-3 days. Increase rest and fluid intake. If for some reason symptoms are worsening or not improving after a couple of days or the urine culture determines the antibiotics you are taking will not treat the infection, the antibiotics may be changed. Return or go to ER for fever, back pain, worsening urinary pain, discharge, increased blood in urine. May take Tylenol or Motrin OTC for pain relief or consider AZO if no contraindications   The most common types of vaginal infections are yeast infections and bacterial vaginosis. Neither of which are really considered to be sexually transmitted. Often a pH swab or wet prep is performed and if abnormal may reveal either type of infection. Begin metronidazole if prescribed for possible BV infection. If there is concern for yeast infection, fluconazole is often prescribed . Take this as directed. You may also apply topical miconazole (can be purchased OTC) externally for relief of itching. Increase rest and fluid  intake. If labs sent out, we will call within 2-5 days with results and amend treatment if necessary. Always try to use pH balanced washes/wipes, urinate after intercourse, stay hydrated, and take probiotics if you are prone to vaginal infections. Return or see PCP or gynecologist for new/worsening infections.       ED Prescriptions     Medication Sig Dispense Auth. Provider   phenazopyridine (PYRIDIUM) 200 MG tablet Take 1 tablet (200 mg total) by mouth 3 (three) times daily. 6 tablet Arvis Huxley B, PA-C   nitrofurantoin , macrocrystal-monohydrate, (MACROBID ) 100 MG capsule Take 1 capsule (100 mg total) by mouth 2 (two) times daily. 10 capsule Arvis Huxley NOVAK, PA-C   fluconazole (DIFLUCAN) 150 MG tablet Take 1 tab po q72 hr 2 tablet Arvis Huxley NOVAK, PA-C      PDMP not reviewed this encounter.   Arvis Huxley B, PA-C 10/25/23 1500

## 2023-10-25 NOTE — Telephone Encounter (Signed)
 FYI Only or Action Required?: FYI only for provider.  Patient was last seen in primary care on 03/09/2023 by Sharma Coyer, MD. Called Nurse Triage reporting Urinary Frequency. Symptoms began several days ago. Interventions attempted: Rest, hydration, or home remedies. Symptoms are: gradually worsening.  Triage Disposition: See Physician Within 24 Hours-patient to be seen at [facility] today-no availability at primary office.   Patient/caregiver understands and will follow disposition?: Yes  Copied from CRM [id number]. Topic: Clinical - Red Word Triage >> Oct 25, 2023  9:31 AM Silvana PARAS wrote: Red Word that prompted transfer to Nurse Triage: UTI symptoms Reason for Disposition  Urinating more frequently than usual (i.e., frequency)  Answer Assessment - Initial Assessment Questions 1. SYMPTOM: What's the main symptom you're concerned about? (e.g., frequency, incontinence)     Urinary frequency 2. ONSET: When did the  urinary frequency  start?     Started last week 3. PAIN: Is there any pain? If Yes, ask: How bad is it? (Scale: 1-10; mild, moderate, severe)     7 out of 10 currently-pain started today 4. CAUSE: What do you think is causing the symptoms?     Possible UTI 5. OTHER SYMPTOMS: Do you have any other symptoms? (e.g., blood in urine, fever, flank pain, pain with urination)     Pain with urination, urine is cloudy 6. PREGNANCY: Is there any chance you are pregnant? When was your last menstrual period?     no  Protocols used: Urinary Symptoms-A-AH

## 2023-10-25 NOTE — Discharge Instructions (Signed)
 UTI: Based on either symptoms or urinalysis, you may have a urinary tract infection. We will send the urine for culture and call with results in a few days. Begin antibiotics at this time. Your symptoms should be much improved over the next 2-3 days. Increase rest and fluid intake. If for some reason symptoms are worsening or not improving after a couple of days or the urine culture determines the antibiotics you are taking will not treat the infection, the antibiotics may be changed. Return or go to ER for fever, back pain, worsening urinary pain, discharge, increased blood in urine. May take Tylenol or Motrin OTC for pain relief or consider AZO if no contraindications   The most common types of vaginal infections are yeast infections and bacterial vaginosis. Neither of which are really considered to be sexually transmitted. Often a pH swab or wet prep is performed and if abnormal may reveal either type of infection. Begin metronidazole if prescribed for possible BV infection. If there is concern for yeast infection, fluconazole is often prescribed . Take this as directed. You may also apply topical miconazole (can be purchased OTC) externally for relief of itching. Increase rest and fluid intake. If labs sent out, we will call within 2-5 days with results and amend treatment if necessary. Always try to use pH balanced washes/wipes, urinate after intercourse, stay hydrated, and take probiotics if you are prone to vaginal infections. Return or see PCP or gynecologist for new/worsening infections.

## 2023-10-25 NOTE — Telephone Encounter (Signed)
 Reviewed. Agree with UC eval

## 2023-10-26 LAB — URINE CULTURE: Culture: <10000 — AB

## 2023-10-27 ENCOUNTER — Ambulatory Visit (HOSPITAL_COMMUNITY): Payer: Self-pay
